# Patient Record
Sex: Male | Born: 1960 | Race: White | Hispanic: No | Marital: Married | Smoking: Former smoker
Health system: Southern US, Community
[De-identification: ages and names within clinical notes are randomized; demographics above are authoritative.]

## PROBLEM LIST (undated history)

## (undated) DIAGNOSIS — Z0000 Encounter for general adult medical examination without abnormal findings: Principal | ICD-10-CM

## (undated) DIAGNOSIS — Z1211 Encounter for screening for malignant neoplasm of colon: Secondary | ICD-10-CM

## (undated) DIAGNOSIS — L299 Pruritus, unspecified: Principal | ICD-10-CM

## (undated) DIAGNOSIS — H9190 Unspecified hearing loss, unspecified ear: Secondary | ICD-10-CM

## (undated) DIAGNOSIS — I469 Cardiac arrest, cause unspecified: Secondary | ICD-10-CM

## (undated) DIAGNOSIS — N400 Enlarged prostate without lower urinary tract symptoms: Secondary | ICD-10-CM

## (undated) DIAGNOSIS — E785 Hyperlipidemia, unspecified: Secondary | ICD-10-CM

## (undated) DIAGNOSIS — Z8249 Family history of ischemic heart disease and other diseases of the circulatory system: Secondary | ICD-10-CM

## (undated) DIAGNOSIS — R7301 Impaired fasting glucose: Secondary | ICD-10-CM

## (undated) DIAGNOSIS — Z87891 Personal history of nicotine dependence: Secondary | ICD-10-CM

## (undated) HISTORY — DX: Personal history of nicotine dependence: Z87.891

## (undated) HISTORY — DX: Benign prostatic hyperplasia without lower urinary tract symptoms: N40.0

## (undated) HISTORY — DX: Impaired fasting glucose: R73.01

## (undated) HISTORY — DX: Family history of ischemic heart disease and other diseases of the circulatory system: Z82.49

## (undated) HISTORY — PX: WISDOM TOOTH EXTRACTION: SHX21

## (undated) HISTORY — DX: Hyperlipidemia, unspecified: E78.5

## (undated) HISTORY — DX: Unspecified hearing loss, unspecified ear: H91.90

---

## 2017-06-08 ENCOUNTER — Other Ambulatory Visit: Payer: Self-pay | Admitting: Internal Medicine

## 2017-06-08 ENCOUNTER — Other Ambulatory Visit (INDEPENDENT_AMBULATORY_CARE_PROVIDER_SITE_OTHER): Payer: Self-pay | Admitting: Otolaryngology

## 2017-06-08 DIAGNOSIS — Z8249 Family history of ischemic heart disease and other diseases of the circulatory system: Secondary | ICD-10-CM

## 2017-06-08 DIAGNOSIS — E785 Hyperlipidemia, unspecified: Secondary | ICD-10-CM

## 2017-06-08 DIAGNOSIS — H9012 Conductive hearing loss, unilateral, left ear, with unrestricted hearing on the contralateral side: Secondary | ICD-10-CM

## 2017-06-23 ENCOUNTER — Ambulatory Visit
Admission: RE | Admit: 2017-06-23 | Discharge: 2017-06-23 | Disposition: A | Discharge status: Home / Self Care | Payer: Commercial Managed Care - PPO | Source: Ambulatory Visit | Attending: Internal Medicine | Admitting: Internal Medicine

## 2017-06-23 ENCOUNTER — Ambulatory Visit
Admission: RE | Admit: 2017-06-23 | Discharge: 2017-06-23 | Disposition: A | Discharge status: Home / Self Care | Payer: Commercial Managed Care - PPO | Source: Ambulatory Visit | Attending: Otolaryngology | Admitting: Otolaryngology

## 2017-06-23 DIAGNOSIS — E785 Hyperlipidemia, unspecified: Secondary | ICD-10-CM

## 2017-06-23 DIAGNOSIS — Z8249 Family history of ischemic heart disease and other diseases of the circulatory system: Secondary | ICD-10-CM

## 2017-06-23 DIAGNOSIS — H9012 Conductive hearing loss, unilateral, left ear, with unrestricted hearing on the contralateral side: Secondary | ICD-10-CM

## 2017-07-12 ENCOUNTER — Other Ambulatory Visit: Payer: Self-pay

## 2017-07-12 ENCOUNTER — Encounter (HOSPITAL_BASED_OUTPATIENT_CLINIC_OR_DEPARTMENT_OTHER): Payer: Self-pay | Admitting: *Deleted

## 2017-07-14 ENCOUNTER — Other Ambulatory Visit: Payer: Self-pay | Admitting: Otolaryngology

## 2017-07-19 ENCOUNTER — Encounter (HOSPITAL_BASED_OUTPATIENT_CLINIC_OR_DEPARTMENT_OTHER)
Admission: RE | Disposition: A | Discharge status: Home / Self Care | Payer: Self-pay | Source: Ambulatory Visit | Attending: Otolaryngology

## 2017-07-19 ENCOUNTER — Ambulatory Visit (HOSPITAL_BASED_OUTPATIENT_CLINIC_OR_DEPARTMENT_OTHER)
Admission: RE | Admit: 2017-07-19 | Discharge: 2017-07-19 | Disposition: A | Discharge status: Home / Self Care | Payer: Commercial Managed Care - PPO | Source: Ambulatory Visit | Attending: Otolaryngology | Admitting: Otolaryngology

## 2017-07-19 ENCOUNTER — Other Ambulatory Visit: Payer: Self-pay

## 2017-07-19 ENCOUNTER — Ambulatory Visit (HOSPITAL_BASED_OUTPATIENT_CLINIC_OR_DEPARTMENT_OTHER): Payer: Commercial Managed Care - PPO | Admitting: Anesthesiology

## 2017-07-19 ENCOUNTER — Encounter (HOSPITAL_BASED_OUTPATIENT_CLINIC_OR_DEPARTMENT_OTHER): Payer: Self-pay | Admitting: Anesthesiology

## 2017-07-19 DIAGNOSIS — H7122 Cholesteatoma of mastoid, left ear: Secondary | ICD-10-CM | POA: Insufficient documentation

## 2017-07-19 DIAGNOSIS — Z87891 Personal history of nicotine dependence: Secondary | ICD-10-CM | POA: Diagnosis not present

## 2017-07-19 DIAGNOSIS — H7012 Chronic mastoiditis, left ear: Secondary | ICD-10-CM | POA: Diagnosis not present

## 2017-07-19 DIAGNOSIS — H9012 Conductive hearing loss, unilateral, left ear, with unrestricted hearing on the contralateral side: Secondary | ICD-10-CM | POA: Insufficient documentation

## 2017-07-19 HISTORY — PX: TYMPANOMASTOIDECTOMY: SHX34

## 2017-07-19 SURGERY — TYMPANOPLASTY, WITH MASTOIDECTOMY
Anesthesia: General | Site: Ear | Laterality: Left

## 2017-07-19 MED ORDER — MIDAZOLAM HCL 2 MG/2ML IJ SOLN
INTRAMUSCULAR | Status: AC
  Filled 2017-07-19: qty 2

## 2017-07-19 MED ORDER — FENTANYL CITRATE (PF) 100 MCG/2ML IJ SOLN
INTRAMUSCULAR | Status: AC
  Filled 2017-07-19: qty 2

## 2017-07-19 MED ORDER — LIDOCAINE 2% (20 MG/ML) 5 ML SYRINGE
INTRAMUSCULAR | Status: DC | PRN
  Administered 2017-07-19: 80 mg via INTRAVENOUS

## 2017-07-19 MED ORDER — CIPROFLOXACIN-FLUOCINOLONE PF 0.3-0.025 % OT SOLN
OTIC | Status: DC | PRN
  Administered 2017-07-19: 1 mL via OTIC

## 2017-07-19 MED ORDER — ONDANSETRON HCL 4 MG/2ML IJ SOLN: 4.0000 mg | Freq: Once | INTRAMUSCULAR | Status: DC | PRN

## 2017-07-19 MED ORDER — EPINEPHRINE PF 1 MG/ML IJ SOLN
INTRAMUSCULAR | Status: DC | PRN
  Administered 2017-07-19: .5 mL

## 2017-07-19 MED ORDER — AMOXICILLIN 875 MG PO TABS: 875.0000 mg | ORAL_TABLET | Freq: Two times a day (BID) | ORAL | 0 refills | Status: DC

## 2017-07-19 MED ORDER — FENTANYL CITRATE (PF) 100 MCG/2ML IJ SOLN
50.0000 ug | INTRAMUSCULAR | Status: DC | PRN
  Administered 2017-07-19 (×2): 50 ug via INTRAVENOUS

## 2017-07-19 MED ORDER — LACTATED RINGERS IV SOLN
INTRAVENOUS | Status: DC
  Administered 2017-07-19 (×2): via INTRAVENOUS

## 2017-07-19 MED ORDER — SCOPOLAMINE 1 MG/3DAYS TD PT72: 1.0000 | MEDICATED_PATCH | Freq: Once | TRANSDERMAL | Status: DC | PRN

## 2017-07-19 MED ORDER — CIPROFLOXACIN-DEXAMETHASONE 0.3-0.1 % OT SUSP
OTIC | Status: DC | PRN
  Administered 2017-07-19: 4 [drp] via OTIC

## 2017-07-19 MED ORDER — DEXAMETHASONE SODIUM PHOSPHATE 4 MG/ML IJ SOLN
INTRAMUSCULAR | Status: DC | PRN
  Administered 2017-07-19: 10 mg via INTRAVENOUS

## 2017-07-19 MED ORDER — OXYCODONE-ACETAMINOPHEN 5-325 MG PO TABS: 1.0000 | ORAL_TABLET | ORAL | 0 refills | Status: DC | PRN

## 2017-07-19 MED ORDER — EPHEDRINE SULFATE 50 MG/ML IJ SOLN
INTRAMUSCULAR | Status: DC | PRN
  Administered 2017-07-19 (×2): 10 mg via INTRAVENOUS
  Administered 2017-07-19: 5 mg via INTRAVENOUS

## 2017-07-19 MED ORDER — LIDOCAINE-EPINEPHRINE 1 %-1:100000 IJ SOLN
INTRAMUSCULAR | Status: DC | PRN
  Administered 2017-07-19: 3.5 mL

## 2017-07-19 MED ORDER — CEFAZOLIN SODIUM 1 G IJ SOLR
INTRAMUSCULAR | Status: AC
  Filled 2017-07-19: qty 20

## 2017-07-19 MED ORDER — CEFAZOLIN SODIUM-DEXTROSE 2-3 GM-%(50ML) IV SOLR
INTRAVENOUS | Status: DC | PRN
  Administered 2017-07-19: 2 g via INTRAVENOUS

## 2017-07-19 MED ORDER — PROPOFOL 500 MG/50ML IV EMUL
INTRAVENOUS | Status: AC
  Filled 2017-07-19: qty 50

## 2017-07-19 MED ORDER — ROCURONIUM BROMIDE 100 MG/10ML IV SOLN
INTRAVENOUS | Status: DC | PRN
  Administered 2017-07-19: 20 mg via INTRAVENOUS
  Administered 2017-07-19: 10 mg via INTRAVENOUS
  Administered 2017-07-19: 40 mg via INTRAVENOUS

## 2017-07-19 MED ORDER — PROPOFOL 10 MG/ML IV BOLUS
INTRAVENOUS | Status: DC | PRN
  Administered 2017-07-19: 200 mg via INTRAVENOUS
  Administered 2017-07-19: 50 mg via INTRAVENOUS

## 2017-07-19 MED ORDER — FENTANYL CITRATE (PF) 100 MCG/2ML IJ SOLN
25.0000 ug | INTRAMUSCULAR | Status: DC | PRN
  Administered 2017-07-19 (×4): 25 ug via INTRAVENOUS

## 2017-07-19 MED ORDER — SUGAMMADEX SODIUM 200 MG/2ML IV SOLN
INTRAVENOUS | Status: AC
  Filled 2017-07-19: qty 2

## 2017-07-19 MED ORDER — OXYCODONE HCL 5 MG/5ML PO SOLN: 5.0000 mg | Freq: Once | ORAL | Status: DC | PRN

## 2017-07-19 MED ORDER — PROPOFOL 10 MG/ML IV BOLUS
INTRAVENOUS | Status: AC
  Filled 2017-07-19: qty 40

## 2017-07-19 MED ORDER — OXYCODONE HCL 5 MG PO TABS: 5.0000 mg | ORAL_TABLET | Freq: Once | ORAL | Status: DC | PRN

## 2017-07-19 MED ORDER — SUGAMMADEX SODIUM 200 MG/2ML IV SOLN
INTRAVENOUS | Status: DC | PRN
  Administered 2017-07-19: 200 mg via INTRAVENOUS

## 2017-07-19 MED ORDER — ATROPINE SULFATE 0.4 MG/ML IJ SOLN
INTRAMUSCULAR | Status: AC
  Filled 2017-07-19: qty 1

## 2017-07-19 MED ORDER — SUCCINYLCHOLINE CHLORIDE 20 MG/ML IJ SOLN
INTRAMUSCULAR | Status: DC | PRN
  Administered 2017-07-19: 100 mg via INTRAVENOUS

## 2017-07-19 MED ORDER — CIPROFLOXACIN-DEXAMETHASONE 0.3-0.1 % OT SUSP
OTIC | Status: AC
  Filled 2017-07-19: qty 7.5

## 2017-07-19 MED ORDER — MIDAZOLAM HCL 2 MG/2ML IJ SOLN
1.0000 mg | INTRAMUSCULAR | Status: DC | PRN
  Administered 2017-07-19: 2 mg via INTRAVENOUS

## 2017-07-19 SURGICAL SUPPLY — 60 items
BLADE CLIPPER SURG (BLADE) ×2 IMPLANT
BLADE NEEDLE 3 SS STRL (BLADE) IMPLANT
BUR DIAMOND COARSE 3.0 (BURR) ×2 IMPLANT
BUR RND OSTEON ELITE 6.0 (BURR) ×2 IMPLANT
CANISTER SUCT 1200ML W/VALVE (MISCELLANEOUS) ×2 IMPLANT
CORD BIPOLAR FORCEPS 12FT (ELECTRODE) IMPLANT
COTTONBALL LRG STERILE PKG (GAUZE/BANDAGES/DRESSINGS) ×2 IMPLANT
DECANTER SPIKE VIAL GLASS SM (MISCELLANEOUS) IMPLANT
DERMABOND ADVANCED (GAUZE/BANDAGES/DRESSINGS) ×1
DERMABOND ADVANCED .7 DNX12 (GAUZE/BANDAGES/DRESSINGS) ×1 IMPLANT
DRAPE MICROSCOPE WILD 40.5X102 (DRAPES) ×2 IMPLANT
DRAPE SURG 17X23 STRL (DRAPES) ×2 IMPLANT
DRAPE SURG IRRIG POUCH 19X23 (DRAPES) ×2 IMPLANT
DRSG GLASSCOCK MASTOID ADT (GAUZE/BANDAGES/DRESSINGS) ×2 IMPLANT
DRSG GLASSCOCK MASTOID PED (GAUZE/BANDAGES/DRESSINGS) IMPLANT
ELECT COATED BLADE 2.86 ST (ELECTRODE) ×2 IMPLANT
ELECT PAIRED SUBDERMAL (MISCELLANEOUS) ×2
ELECT REM PT RETURN 9FT ADLT (ELECTROSURGICAL) ×2
ELECTRODE PAIRED SUBDERMAL (MISCELLANEOUS) ×1 IMPLANT
ELECTRODE REM PT RTRN 9FT ADLT (ELECTROSURGICAL) ×1 IMPLANT
GAUZE SPONGE 4X4 12PLY STRL (GAUZE/BANDAGES/DRESSINGS) IMPLANT
GAUZE SPONGE 4X4 12PLY STRL LF (GAUZE/BANDAGES/DRESSINGS) IMPLANT
GAUZE SPONGE 4X4 16PLY XRAY LF (GAUZE/BANDAGES/DRESSINGS) ×2 IMPLANT
GLOVE BIO SURGEON STRL SZ 6.5 (GLOVE) ×2 IMPLANT
GLOVE BIO SURGEON STRL SZ7.5 (GLOVE) ×2 IMPLANT
GLOVE BIOGEL PI IND STRL 7.0 (GLOVE) ×1 IMPLANT
GLOVE BIOGEL PI INDICATOR 7.0 (GLOVE) ×1
GOWN STRL REUS W/ TWL LRG LVL3 (GOWN DISPOSABLE) ×2 IMPLANT
GOWN STRL REUS W/TWL LRG LVL3 (GOWN DISPOSABLE) ×2
HEMOSTAT SURGICEL .5X2 ABSORB (HEMOSTASIS) IMPLANT
HEMOSTAT SURGICEL 2X14 (HEMOSTASIS) IMPLANT
IV CATH AUTO 14GX1.75 SAFE ORG (IV SOLUTION) ×2 IMPLANT
IV NS 500ML (IV SOLUTION) ×1
IV NS 500ML BAXH (IV SOLUTION) ×1 IMPLANT
IV SET EXT 30 76VOL 4 MALE LL (IV SETS) ×2 IMPLANT
NDL SAFETY ECLIPSE 18X1.5 (NEEDLE) ×1 IMPLANT
NEEDLE FILTER BLUNT 18X 1/2SAF (NEEDLE)
NEEDLE FILTER BLUNT 18X1 1/2 (NEEDLE) IMPLANT
NEEDLE HYPO 18GX1.5 SHARP (NEEDLE) ×1
NEEDLE HYPO 25X1 1.5 SAFETY (NEEDLE) ×2 IMPLANT
NS IRRIG 1000ML POUR BTL (IV SOLUTION) ×2 IMPLANT
PACK AMBRUS EAR (MISCELLANEOUS) ×2 IMPLANT
PACK BASIN DAY SURGERY FS (CUSTOM PROCEDURE TRAY) ×2 IMPLANT
PACK ENT DAY SURGERY (CUSTOM PROCEDURE TRAY) ×2 IMPLANT
PENCIL BUTTON HOLSTER BLD 10FT (ELECTRODE) ×2 IMPLANT
PROBE NERVBE PRASS .33 (MISCELLANEOUS) IMPLANT
SLEEVE SCD COMPRESS KNEE MED (MISCELLANEOUS) ×2 IMPLANT
SPONGE SURGIFOAM ABS GEL 12-7 (HEMOSTASIS) ×2 IMPLANT
SUT BONE WAX W31G (SUTURE) IMPLANT
SUT VIC AB 3-0 SH 27 (SUTURE)
SUT VIC AB 3-0 SH 27X BRD (SUTURE) IMPLANT
SUT VIC AB 4-0 P-3 18XBRD (SUTURE) IMPLANT
SUT VIC AB 4-0 P3 18 (SUTURE)
SUT VICRYL 4-0 PS2 18IN ABS (SUTURE) ×2 IMPLANT
SYR 5ML LL (SYRINGE) ×2 IMPLANT
SYR BULB 3OZ (MISCELLANEOUS) ×2 IMPLANT
TOWEL OR 17X24 6PK STRL BLUE (TOWEL DISPOSABLE) ×2 IMPLANT
TOWEL OR NON WOVEN STRL DISP B (DISPOSABLE) IMPLANT
TRAY DSU PREP LF (CUSTOM PROCEDURE TRAY) ×2 IMPLANT
TUBING IRRIGATION (MISCELLANEOUS) ×2 IMPLANT

## 2017-07-19 NOTE — Anesthesia Procedure Notes (Signed)
Procedure Name: Intubation Date/Time: 07/19/2017 8:28 AM Performed by: Caren Macadamarter, Perri Lamagna W, CRNA Pre-anesthesia Checklist: Patient identified, Emergency Drugs available, Suction available and Patient being monitored Patient Re-evaluated:Patient Re-evaluated prior to induction Oxygen Delivery Method: Circle system utilized Preoxygenation: Pre-oxygenation with 100% oxygen Induction Type: IV induction Ventilation: Mask ventilation without difficulty Laryngoscope Size: Miller and 2 Grade View: Grade I Tube type: Oral Tube size: 7.0 mm Number of attempts: 1 Airway Equipment and Method: Stylet and Oral airway Placement Confirmation: ETT inserted through vocal cords under direct vision,  positive ETCO2 and breath sounds checked- equal and bilateral Secured at: 22 cm Tube secured with: Tape Dental Injury: Teeth and Oropharynx as per pre-operative assessment

## 2017-07-19 NOTE — Discharge Instructions (Addendum)
POSTOPERATIVE INSTRUCTIONS FOR PATIENTS HAVING MASTOIDECTOMY SURGERY  1. You may have nausea, vomiting, or a low grade fever for a few days after surgery. This is not unusual.  However, if the nausea and vomiting become severe or last more than one day, please call our office. Medication for nausea may be prescribed. You may take Tylenol every four hours for fever. If your fever should rise above 101 F, please contact our office. 2. Limit your activities for one week. This includes avoiding heavy lifting (over 20lbs), vigorous exercise, and contact sports. 3. Do not blow your nose for approximately one week.  Any accumulation in the nose should be drawn back into the throat and expectorated through the mouth to avoid infecting the ear. If it is necessary to sneeze, do so with your mouth open to decrease pressure to your ears. Do not hold your nose to avoid sneezing.  4. Some dull postoperative ear pain is expected. Your physician may prescribe pain medicine to help relieve your discomfort. If your postoperative pain increases and your medication is not helping, please call the office before taking any other medication that we have not prescribed or recommended. 5. If any of the following should occur, contact Dr.Teoh:   (Office: [phone])) a. Persistent bleeding                                                             b. Persistent fever c. Purulent drainage (pus) from the ear or incision d. Increasing redness around the suture line e. Persistent pain or dizziness f. Facial weakness 6. Sometimes, with a larger incision behind the ear, the incision may open and drain. If it occurs, please contact our office. 7. If your physician prescribes an antibiotic, fill the prescription promptly and take all of the medicine as directed until the entire supply is gone. 8.  You may experience some popping and cracking sounds in the ear for up to several weeks. It may sound like you are talking in a  barrel or a tunnel. This is normal and should not cause concern. 9. Because a nerve for taste passes through your ear, it is not unusual for your taste sensation to be altered for several weeks or months. 10. You may experience some numbness in your outer ear, earlobe, and the incision area. This is normal, and most of the numbness will be expected to fade over a period of time.                                                               11. Your eardrum may look pink or red for up to a month postoperatively. The red coloration is due to fluid in the middle ear. The change in color should not be confused with infection.  12. It is important to return to our office for your postoperative appointment as scheduled. If for some reason you were not given a postoperative appointment, please call our office at [phone].   Post Anesthesia Home Care Instructions  Activity: Get plenty of rest for the remainder of the day.  A responsible individual must stay with you for 24 hours following the procedure.  For the next 24 hours, DO NOT: -Drive a car -Advertising copywriterperate machinery -Drink alcoholic beverages -Take any medication unless instructed by your physician -Make any legal decisions or sign important papers.  Meals: Start with liquid foods such as gelatin or soup. Progress to regular foods as tolerated. Avoid greasy, spicy, heavy foods. If nausea and/or vomiting occur, drink only clear liquids until the nausea and/or vomiting subsides. Call your physician if vomiting continues.  Special Instructions/Symptoms: Your throat may feel dry or sore from the anesthesia or the breathing tube placed in your throat during surgery. If this causes discomfort, gargle with warm salt water. The discomfort should disappear within 24 hours.  If you had a scopolamine patch placed behind your ear for the management of post- operative nausea and/or vomiting:  1. The medication in the patch is effective for 72 hours,  after which it should be removed.  Wrap patch in a tissue and discard in the trash. Wash hands thoroughly with soap and water. 2. You may remove the patch earlier than 72 hours if you experience unpleasant side effects which may include dry mouth, dizziness or visual disturbances. 3. Avoid touching the patch. Wash your hands with soap and water after contact with the patch.

## 2017-07-19 NOTE — Transfer of Care (Signed)
Immediate Anesthesia Transfer of Care Note  Patient: Jake KicksDavid N Musolino  Procedure(s) Performed: LEFT CANAL WALL TYMPANOMASTOIDECTOMY (Left Ear)  Patient Location: PACU  Anesthesia Type:General  Level of Consciousness: awake and alert   Airway & Oxygen Therapy: Patient Spontanous Breathing and Patient connected to face mask oxygen  Post-op Assessment: Report given to RN and Post -op Vital signs reviewed and stable  Post vital signs: Reviewed and stable  Last Vitals:  Vitals Value Taken Time  BP    Temp    Pulse    Resp    SpO2      Last Pain:  Vitals:   07/19/17 0733  TempSrc: Oral         Complications: No apparent anesthesia complications

## 2017-07-19 NOTE — H&P (Signed)
Cc: Left ear hearing loss, cholesteatoma  HPI: The patient is a 57 year old male who returns today for his follow-up evaluation.  The patient has a history of left middle ear effusion and recurrent left ear drainage.  He previously underwent left myringotomy and tube placement.  Despite tube placement, he continues to have significant left ear conductive hearing loss.  He underwent a temporal bone CT scan this month.  The CT showed opacification of his left middle ear space and the left mastoid air cells.  The findings were concerning for cholesteatoma.  The patient returns today complaining of persistent left ear hearing difficulty.  He has noted intermittent drainage from the left ear.  He currently denies any vertigo or headache. No other ENT, GI, or respiratory issue noted since the last visit.   Exam: General: Communicates without difficulty, well nourished, no acute distress. Head: Normocephalic, no evidence injury, no tenderness, facial buttresses intact without stepoff. Eyes: PERRL, EOMI. No scleral icterus, conjunctivae clear. Neuro: CN II exam reveals vision grossly intact. No nystagmus at any point of gaze. Ears: Auricles well formed without lesions. Ear canals are intact without mass or lesion. No erythema or edema is appreciated. The left ventilating tube is noted to be in place and patent. No drainage is noted. The left TM is retracted. Nose: External evaluation reveals normal support and skin without lesions. Dorsum is intact. Anterior rhinoscopy reveals mildly congested mucosa over anterior aspect of inferior turbinates and intact septum. No purulence noted. Oral:  Oral cavity and oropharynx are intact, symmetric, without erythema or edema. Mucosa is moist without lesions. Neck: Full range of motion without pain. There is no significant lymphadenopathy. No masses palpable. Thyroid bed within normal limits to palpation. Parotid glands and submandibular glands equal bilaterally without mass.  Trachea is midline. Neuro:  CN 2-12 grossly intact. Gait normal. Vestibular: No nystagmus at any point of gaze. The cerebellar examination is unremarkable.   Assessment 1.  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   2.  The left ventilating tube is in place and patent.   Plan  1.  The physical exam findings and the CT images are extensively reviewed with the patient.  2.  The pathophysiology of cholesteatoma is also reviewed with the patient and his wife.  3.  Based on the above findings, the patient will need to undergo tympanomastoidectomy surgery to remove the cholesteatoma. The options of canal wall-up versus canal wall-down approaches are discussed.  4.  The patient is interested in proceeding with canal wall-down tympanomastoidectomy surgery.  We will schedule the procedure in accordance with the patient's schedule.

## 2017-07-19 NOTE — Anesthesia Postprocedure Evaluation (Signed)
Anesthesia Post Note  Patient: Jake Patterson  Procedure(s) Performed: LEFT CANAL WALL TYMPANOMASTOIDECTOMY (Left Ear)     Patient location during evaluation: PACU Anesthesia Type: General Level of consciousness: awake and alert Pain management: pain level controlled Vital Signs Assessment: post-procedure vital signs reviewed and stable Respiratory status: spontaneous breathing, nonlabored ventilation, respiratory function stable and patient connected to nasal cannula oxygen Cardiovascular status: blood pressure returned to baseline and stable Postop Assessment: no apparent nausea or vomiting Anesthetic complications: no    Last Vitals:  Vitals:   07/19/17 1200 07/19/17 1215  BP: 135/79 122/82  Pulse: 87 79  Resp: 10 12  Temp:    SpO2: 94% 96%    Last Pain:  Vitals:   07/19/17 1215  TempSrc:   PainSc: 2                  Doroteo Nickolson,Myreon COKER

## 2017-07-19 NOTE — Anesthesia Preprocedure Evaluation (Addendum)
Anesthesia Evaluation  Patient identified by MRN, date of birth, ID band Patient awake    Reviewed: Allergy & Precautions, NPO status , Patient's Chart, lab work & pertinent test results  Airway Mallampati: II  TM Distance: >3 FB Neck ROM: Full    Dental  (+) Teeth Intact, Dental Advisory Given   Pulmonary former smoker,    breath sounds clear to auscultation       Cardiovascular  Rhythm:Regular Rate:Normal     Neuro/Psych    GI/Hepatic   Endo/Other    Renal/GU      Musculoskeletal   Abdominal   Peds  Hematology   Anesthesia Other Findings   Reproductive/Obstetrics                            Anesthesia Physical Anesthesia Plan  ASA: II  Anesthesia Plan: General   Post-op Pain Management:    Induction: Intravenous  PONV Risk Score and Plan: Ondansetron and Treatment may vary due to age or medical condition  Airway Management Planned: Oral ETT  Additional Equipment:   Intra-op Plan:   Post-operative Plan: Extubation in OR  Informed Consent: I have reviewed the patients History and Physical, chart, labs and discussed the procedure including the risks, benefits and alternatives for the proposed anesthesia with the patient or authorized representative who has indicated his/her understanding and acceptance.   Dental advisory given  Plan Discussed with: CRNA and Anesthesiologist  Anesthesia Plan Comments:         Anesthesia Quick Evaluation

## 2017-07-19 NOTE — Op Note (Signed)
DATE OF PROCEDURE: 07/19/2017  SURGEON: Newman Pies, MD  OPERATIVE REPORT   PREOPERATIVE DIAGNOSIS:  1. Left ear cholesteatoma 2. Left chronic otomastoiditis 3. Left ear conductive hearing loss  POSTOPERATIVE DIAGNOSIS:  1. Left ear cholesteatoma 2. Left chronic otomastoiditis 3. Left ear conductive hearing loss  PROCEDURES PERFORMED: 1. Left modified radical canal-wall-down tympanomastoidectomy  ANESTHESIA: General endotracheal tube anesthesia.  COMPLICATIONS: None.  ESTIMATED BLOOD LOSS:  INDICATION FOR PROCEDURE:   Jake Patterson is a 57 y.o. male with a history of left ear chronic otomastoiditis and left ear conductive hearing loss. He was previously treated with multiple courses of topical antibiotics. However, he continues to have persistent left ear drainage.  On his CT scan, he was noted to have extensive soft tissue within the left middle ear cavity, extending into the epitympanum and filling Prussak's space, consistent with acquired cholesteatoma. There was also diffuse soft tissue or fluid throughout the mastoid air cells, representing extension of cholesteatoma. Based on the above findings, the decision was made for patient to undergo the above-stated procedure. The risks, benefits, alternatives, and details of the procedures were discussed with the patient. Questions were invited and answered. Informed consent was obtained.  DESCRIPTION OF PROCEDURE: The patient was taken to the operating room and placed supine on the operating table. General endotracheal tube anesthesia was induced by the anesthesiologist.The patient was positioned and prepped and draped in a standard fashion for left ear surgery. Facial nerve monitoring electrodes were placed. The facial nerve monitoring system was functional throughout the case.  1% lidocaine with 1-100,000 epinephrine was infiltrated into the left postauricular crease and into all 4 quadrants of the ear canal. Under  the operating microscope, the left ear canal was cleaned of all cerumen. The pars flaccida portion of the tympanic membrane was noted to be retracted. A standard tympanomeatal flap was elevated in a standard fashion. The malleus and incus were partially eroded.  A standard postauricular incision was made. The soft tissue covering the mastoid cortex was carefully elevated. A 2 x 2 centimeters temporalis fascia graft was harvested in the standard fashion. Using a #7 cutting bur, a standard cortical mastoidectomy procedure was performed. The mastoid was significantly sclerotic. The tegmen and sigmoid sinus were identified and preserved. The posterior canal wall was taken down to the level of the facial nerve. The antrum was entered. The partially eroded malleus and incus were removed. The stapes was noted to be intact and mobile. All visible granulation tissue and cholesteatoma were removed. The mastoid and middle ear space were copiously irrigated. The previously harvested temporalis fascia graft was used to reconstruct a new tympanum. Gelfoam soaked with otovel solution was used to pack the middle ear space and the mastoid cavity.  Attention was then focused on the meatoplasty portion of the case. A 6:00 and 12:00 incision was made. The posterior concha cartilage was removed. The posterior soft tissue flap was then sutured to the posterior aspect of the mastoid cavity. A large meatal opening was achieved.  The postauricular incision was then closed in layers with 4-0 Vicryl and Dermabond. A Glasscock dressing was applied. That concluded the procedure for the patient. The care of the patient was turned over to the anesthesiologist. The patient was awakened from anesthesia without difficulty. He was extubated and transferred to the recovery room in good condition.  OPERATIVE FINDINGS: Chronic otomastoiditis with a large amount of granulation tissue and cholesteatoma. The stapes was noted to be intact  and mobile.  SPECIMEN:  Right middle ear and mastoid contents  FOLLOWUP CARE: The patient will be discharged home once he is awake and alert. He will follow up in my office in 1 week.

## 2017-07-20 ENCOUNTER — Encounter (HOSPITAL_BASED_OUTPATIENT_CLINIC_OR_DEPARTMENT_OTHER): Payer: Self-pay | Admitting: Otolaryngology

## 2017-08-05 ENCOUNTER — Ambulatory Visit: Payer: Commercial Managed Care - PPO | Admitting: Cardiology

## 2017-08-11 ENCOUNTER — Encounter: Payer: Self-pay | Admitting: Cardiology

## 2017-08-11 ENCOUNTER — Ambulatory Visit (INDEPENDENT_AMBULATORY_CARE_PROVIDER_SITE_OTHER): Payer: Commercial Managed Care - PPO | Admitting: Cardiology

## 2017-08-11 VITALS — BP 120/72 | HR 82 | Ht 69.0 in | Wt 178.0 lb

## 2017-08-11 DIAGNOSIS — Z01818 Encounter for other preprocedural examination: Secondary | ICD-10-CM

## 2017-08-11 DIAGNOSIS — E785 Hyperlipidemia, unspecified: Secondary | ICD-10-CM | POA: Diagnosis not present

## 2017-08-11 DIAGNOSIS — I2510 Atherosclerotic heart disease of native coronary artery without angina pectoris: Secondary | ICD-10-CM

## 2017-08-11 DIAGNOSIS — Z8249 Family history of ischemic heart disease and other diseases of the circulatory system: Secondary | ICD-10-CM | POA: Insufficient documentation

## 2017-08-11 MED ORDER — METOPROLOL TARTRATE 50 MG PO TABS: 50.0000 mg | ORAL_TABLET | Freq: Once | ORAL | 0 refills | Status: DC

## 2017-08-11 NOTE — Patient Instructions (Signed)
MEDICATION INSTRUCTIONS  NO CHANGES   EXCEPT ON THE DAY OF CORONARY CTA     WILL SCHEDULE AT [facility] Your physician has requested that you have cardiac CT. Cardiac computed tomography (CT) is a painless test that uses an x-ray machine to take clear, detailed pictures of your heart. For further information please visit [email or web address] Please follow instruction sheet as given.    Your physician recommends that you schedule a follow-up appointment in 2 MONTHS WITH DR HARDING.      INSTRUCTIONS FOR  CORONARY CTA    Please arrive at the [facility] main entrance of [facility] at (30-45 minutes prior to test start time)  [facility] [address] [place], [place] [address] [phone]  Proceed to the [facility] Radiology Department (First Floor).  Please follow these instructions carefully (unless otherwise directed):  PLEASE HAVE LABS - BMP  AT LEAST ONE WEEK PRIOR TO TEST  On the Night Before the Test: . Drink plenty of water. . Do not consume any caffeinated/decaffeinated beverages or chocolate 12 hours prior to your test. . Do not take any antihistamines 12 hours prior to your test.    On the Day of the Test: . Drink plenty of water. Do not drink any water within one hour of the test. . Do not eat any food 4 hours prior to the test. . You may take your regular medications prior to the test. . Take 50 mg of lopressor (metoprolol) one hour before the test.   After the Test: . Drink plenty of water. . After receiving IV contrast, you may experience a mild flushed feeling. This is normal. . On occasion, you may experience a mild rash up to 24 hours after the test. This is not dangerous. If this occurs, you can take Benadryl 25 mg and increase your fluid intake. . If you experience trouble breathing, this can be serious. If it is severe call 911 IMMEDIATELY. If it is mild, please call our office.

## 2017-08-11 NOTE — Progress Notes (Signed)
PCP: Martha Clan, MD  Clinic Note:  Chief Complaint  Patient presents with  . New Patient (Initial Visit)    had CT of heart, PCP advised to come be seen, pt denies chest pains, can feel palpitations at times, denies SOB, and swelling in hands/feet    HPI: Jake Patterson is a 57 y.o. male who is being seen today for the evaluation of Abnormal Coronary Calcium Score (387) -coronary calcification within LAD and circumflex.  At the request of Martha Clan, MD.  Jake Patterson was seen on March 14 and then April 26 by Dr.Shaw.  In reviewing his past medical history and family history, he was noted that he has a pretty significant family history for CAD with his father having an MI dating back to age 57 with CABG at age 72.  Paternal grandfather also had early MIs.  -->  As part of the baseline evaluation he underwent coronary calcium score testing. He was also noted to have impaired fasting glucose levels, mild erectile dysfunction and hyperlipidemia as Cardiac Risk Factors.  Recent Hospitalizations: None  Studies Personally Reviewed - (if available, images/films reviewed: From Epic Chart or Care Everywhere)  Coronary Calcium Score June 23, 2016: Agatston score 385 (93rd percentile).  LAD and circumflex coronary calcification noted.   Interval History: Jake Patterson presents here today for cardiology evaluation really without any major complaints.  The biggest issue is had lately has been some hearing issues.  He denies any sensation whatsoever chest tightness or pressure with rest or exertion.  He does however note that he is not able to do exertion to the extent that he had been previously.  He also notes occasional "hiccups" or skipped heartbeats occur mostly at rest.  These are not significant and are relatively rare.  Cardiac review of symptoms: No chest pain or shortness of breath with rest or exertion. No PND, orthopnea or edema. No palpitations, lightheadedness, dizziness, weakness or  syncope/near syncope. No TIA/amaurosis fugax symptoms. No melena, hematochezia, hematuria, or epstaxis. No claudication.  ROS: A comprehensive was performed. Review of Systems  Constitutional: Negative for malaise/fatigue.  HENT: Positive for hearing loss (Has had some hearing issues with his left ear.  Improved). Negative for nosebleeds.   Respiratory: Negative for cough and shortness of breath.   Gastrointestinal: Negative for heartburn.  Musculoskeletal: Positive for joint pain (Mild arthritis pains).  Neurological: Negative for dizziness.  All other systems reviewed and are negative.   I have reviewed and (if needed) personally updated the patient's problem list, medications, allergies, past medical and surgical history, social and family history.   Past Medical History:  Diagnosis Date  . BPH (benign prostatic hyperplasia)   . Family history of premature CAD   . Former light tobacco smoker    Less than 1/4 pack/day x 20 years  . Hard of hearing    Left ear  . Hyperlipidemia with target LDL less than 100   . Impaired fasting glucose     Past Surgical History:  Procedure Laterality Date  . TYMPANOMASTOIDECTOMY Left 07/19/2017   Procedure: LEFT CANAL WALL TYMPANOMASTOIDECTOMY;  Surgeon: Newman Pies, MD;  Location: [facility];  Service: ENT;  Laterality: Left;  . WISDOM TOOTH EXTRACTION      Current Meds  Medication Sig  . Omega-3 Fatty Acids (FISH OIL PO) Take by mouth.  . Probiotic Product (PROBIOTIC DAILY PO) Take by mouth.  . rosuvastatin (CRESTOR) 20 MG tablet Take 20 mg by mouth daily.  Marland Kitchen  Saw Palmetto, Serenoa repens, (SAW PALMETTO PO) Take by mouth.  . sildenafil (REVATIO) 20 MG tablet Take 20 mg by mouth 3 (three) times daily.    No Known Allergies  Social History   Tobacco Use  . Smoking status: Former Smoker    Packs/day: 0.25    Years: 20.00    Pack years: 5.00    Types: Cigarettes    Start date: 08/13/1980    Last attempt to quit: 08/13/2000     Years since quitting: 17.0  . Smokeless tobacco: Never Used  . Tobacco comment: Quit smoking 2003  Substance Use Topics  . Alcohol use: Yes    Comment: Occass  . Drug use: Never   Social History   Social History Narrative   Is a married father 2 adult daughters   He moved to the [place] from [place] outside [place] in 1988.   He enjoys cycling, running and up until about a year ago was playing club soccer. -->  He currently exercises about 1/2- 1 1/2 hours every day doing cycling or running.   He works as an Art gallery manager for [employer]   He was a light smoker for about 20 years from 1982 to 2002.   Social alcohol.    family history includes Alzheimer's disease in his mother; Coronary artery disease (age of onset: 69) in his father; Coronary artery disease (age of onset: 89) in his paternal grandfather; Heart attack (age of onset: 69) in his father.  Wt Readings from Last 3 Encounters:  08/11/17 178 lb (80.7 kg)  07/19/17 186 lb 4 oz (84.5 kg)    PHYSICAL EXAM BP 120/72 (BP Location: Right Arm)   Pulse 82   Ht  (1.753 m)   Wt 178 lb (80.7 kg)   BMI 26.29 kg/m  Physical Exam  Constitutional: He appears well-developed and well-nourished. No distress.  Healthy-appearing.  Well-groomed.  HENT:  Head: Normocephalic and atraumatic.  Eyes: Pupils are equal, round, and reactive to light. Conjunctivae and EOM are normal.  Neck: Normal range of motion. Neck supple. No hepatojugular reflux and no JVD present. Carotid bruit is not present.  Cardiovascular: Normal rate, regular rhythm, normal heart sounds and intact distal pulses.  No extrasystoles are present. PMI is not displaced. Exam reveals no gallop and no friction rub.  No murmur heard. Pulmonary/Chest: Effort normal. No respiratory distress. He has no wheezes. He has no rales.  Abdominal: Soft. Bowel sounds are normal. He exhibits no distension. There is no tenderness. There is no rebound.  Musculoskeletal: Normal range of  motion. He exhibits no edema.  Neurological: He is alert. No cranial nerve deficit.  Skin: Skin is warm and dry.  Psychiatric: His behavior is normal. Judgment and thought content normal.  Nursing note and vitals reviewed.    Adult ECG Report  Rate: 82;  Rhythm: normal sinus rhythm and premature ventricular contractions (PVC) -interpolated.  Otherwise normal axis, intervals and durations.   Narrative Interpretation: Relatively normal EKG.  PVCs noted, but not symptomatic as they are interpolated.  Other studies Reviewed: Additional studies/ records that were reviewed today include:  Recent (pertinent) labs: February 2019  TC 212, TG 104, LDL 130, HDL 61.  A1c 5.4.  BUN/creatinine 19/1.2.  TSH 2.29.  ASSESSMENT / PLAN: Problem List Items Addressed This Visit    Hyperlipidemia with target LDL less than 100 (Chronic)    At present, until we know what is extent of coronary artery disease is, we will recommend LDL goal of  less than 100.  He has just been started on Crestor.      Relevant Medications   sildenafil (REVATIO) 20 MG tablet   Family history of premature CAD (Chronic)    Pretty significant family history of CAD.  That risk factor along with a more further evaluation of an abnormal coronary calcium score. Plan: Coronary CT angiogram. Statin, and aspirin.  Otherwise blood pressures well controlled so no beta-blocker is required. Continue to stay active and exercise --> if symptoms warrant, would then pursue more aggressive evaluation.       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  Since he is not actively having symptoms, I think  we will assess him with a Coronary CT Angiogram will allow [place] also check FFR if needed.   Baseline modification would include statin which we just started, and would recommend adding aspirin.      Relevant Medications   sildenafil (REVATIO) 20 MG tablet   Other Relevant Orders   Basic metabolic panel   CT CORONARY MORPH W/CTA COR W/SCORE W/CA W/CM &/OR WO/CM   CT CORONARY FRACTIONAL FLOW RESERVE DATA PREP   CT CORONARY FRACTIONAL FLOW RESERVE FLUID ANALYSIS    Other Visit Diagnoses    Pre-op testing           I spent a total of with the patient and chart review. >  50% of the time was spent in direct patient consultation.   Current medicines are reviewed at length with the patient today.  (+/- concerns) n/a The following changes have been made:  n/a  Patient Instructions  MEDICATION INSTRUCTIONS  NO CHANGES   EXCEPT ON THE DAY OF CORONARY CTA   WILL SCHEDULE AT [facility] Your physician has requested that you have cardiac CT. Cardiac computed tomography (CT) is a painless test that uses an x-ray machine to take clear, detailed pictures of your heart. For further information please visit [email or web address] Please follow instruction sheet as given.   Your physician recommends that you schedule a follow-up appointment in 2 MONTHS WITH DR HARDING.  Studies Ordered:   Orders Placed This Encounter  Procedures  . CT CORONARY MORPH W/CTA COR W/SCORE W/CA W/CM &/OR WO/CM  . CT CORONARY FRACTIONAL FLOW RESERVE DATA PREP  . CT CORONARY FRACTIONAL FLOW RESERVE FLUID ANALYSIS  . Basic metabolic panel      Bryan Lemma, M.D., M.S. Interventional Cardiologist   Pager # [phone] Phone # [phone] [address] [place], [place] [address]   Thank you for choosing Heart[facility] at [facility]!!

## 2017-08-13 ENCOUNTER — Encounter: Payer: Self-pay | Admitting: Cardiology

## 2017-08-13 NOTE — Assessment & Plan Note (Signed)
Relatively significant calcification noted on calcium score with an elevated score.  Since he is not actively having symptoms, I think we will assess him with a Coronary CT Angiogram will allow [place] also check FFR if needed.   Baseline modification would include statin which we just started, and would recommend adding aspirin.

## 2017-08-13 NOTE — Assessment & Plan Note (Signed)
Pretty significant family history of CAD.  That risk factor along with a more further evaluation of an abnormal coronary calcium score. Plan: Coronary CT angiogram. Statin, and aspirin.  Otherwise blood pressures well controlled so no beta-blocker is required. Continue to stay active and exercise --> if symptoms warrant, would then pursue more aggressive evaluation.

## 2017-08-13 NOTE — Assessment & Plan Note (Signed)
At present, until we know what is extent of coronary artery disease is, we will recommend LDL goal of less than 100.  He has just been started on Crestor.

## 2017-08-18 NOTE — Addendum Note (Signed)
Addended by: Barrie Dunker on: 08/18/2017 10:41 AM   Modules accepted: Orders

## 2017-09-15 LAB — BASIC METABOLIC PANEL
BUN / CREAT RATIO: 13 (ref 9–20)
BUN: 14 mg/dL (ref 6–24)
CO2: 26 mmol/L (ref 20–29)
CREATININE: 1.1 mg/dL (ref 0.76–1.27)
Calcium: 10.1 mg/dL (ref 8.7–10.2)
Chloride: 103 mmol/L (ref 96–106)
GFR calc Af Amer: 86 mL/min/{1.73_m2} (ref >59)
GFR, EST NON AFRICAN AMERICAN: 75 mL/min/{1.73_m2} (ref >59)
Glucose: 99 mg/dL (ref 65–99)
Potassium: 5.3 mmol/L — ABNORMAL HIGH (ref 3.5–5.2)
SODIUM: 142 mmol/L (ref 134–144)

## 2017-09-23 ENCOUNTER — Encounter (HOSPITAL_COMMUNITY): Payer: Self-pay

## 2017-09-23 ENCOUNTER — Other Ambulatory Visit: Payer: Self-pay

## 2017-09-23 ENCOUNTER — Observation Stay (HOSPITAL_COMMUNITY)
Admission: EM | Admit: 2017-09-23 | Discharge: 2017-09-25 | Disposition: A | Discharge status: Home / Self Care | Payer: Commercial Managed Care - PPO | Source: Other Acute Inpatient Hospital | Attending: Cardiology | Admitting: Cardiology

## 2017-09-23 ENCOUNTER — Encounter (HOSPITAL_COMMUNITY): Payer: Self-pay | Admitting: Emergency Medicine

## 2017-09-23 ENCOUNTER — Emergency Department (HOSPITAL_COMMUNITY): Payer: Commercial Managed Care - PPO

## 2017-09-23 ENCOUNTER — Ambulatory Visit (HOSPITAL_COMMUNITY): Payer: Commercial Managed Care - PPO

## 2017-09-23 ENCOUNTER — Ambulatory Visit (HOSPITAL_COMMUNITY)
Admission: RE | Admit: 2017-09-23 | Discharge: 2017-09-23 | Disposition: A | Discharge status: Home / Self Care | Payer: Commercial Managed Care - PPO | Source: Ambulatory Visit | Attending: Cardiology | Admitting: Cardiology

## 2017-09-23 DIAGNOSIS — I468 Cardiac arrest due to other underlying condition: Secondary | ICD-10-CM | POA: Diagnosis not present

## 2017-09-23 DIAGNOSIS — R001 Bradycardia, unspecified: Secondary | ICD-10-CM | POA: Diagnosis present

## 2017-09-23 DIAGNOSIS — Y92538 Other ambulatory health services establishments as the place of occurrence of the external cause: Secondary | ICD-10-CM | POA: Diagnosis not present

## 2017-09-23 DIAGNOSIS — N400 Enlarged prostate without lower urinary tract symptoms: Secondary | ICD-10-CM | POA: Insufficient documentation

## 2017-09-23 DIAGNOSIS — I469 Cardiac arrest, cause unspecified: Secondary | ICD-10-CM | POA: Diagnosis not present

## 2017-09-23 DIAGNOSIS — E785 Hyperlipidemia, unspecified: Secondary | ICD-10-CM | POA: Insufficient documentation

## 2017-09-23 DIAGNOSIS — I2510 Atherosclerotic heart disease of native coronary artery without angina pectoris: Secondary | ICD-10-CM | POA: Diagnosis not present

## 2017-09-23 DIAGNOSIS — T447X5A Adverse effect of beta-adrenoreceptor antagonists, initial encounter: Secondary | ICD-10-CM | POA: Diagnosis not present

## 2017-09-23 DIAGNOSIS — Z8249 Family history of ischemic heart disease and other diseases of the circulatory system: Secondary | ICD-10-CM | POA: Insufficient documentation

## 2017-09-23 DIAGNOSIS — Z87891 Personal history of nicotine dependence: Secondary | ICD-10-CM | POA: Insufficient documentation

## 2017-09-23 DIAGNOSIS — R112 Nausea with vomiting, unspecified: Secondary | ICD-10-CM | POA: Diagnosis not present

## 2017-09-23 HISTORY — DX: Cardiac arrest, cause unspecified: I46.9

## 2017-09-23 LAB — CBC WITH DIFFERENTIAL/PLATELET
Abs Immature Granulocytes: 0 10*3/uL (ref 0.0–0.1)
BASOS PCT: 0 %
Basophils Absolute: 0 10*3/uL (ref 0.0–0.1)
EOS ABS: 0.1 10*3/uL (ref 0.0–0.7)
Eosinophils Relative: 1 %
HCT: 38.7 % — ABNORMAL LOW (ref 39.0–52.0)
Hemoglobin: 13 g/dL (ref 13.0–17.0)
Immature Granulocytes: 0 %
Lymphocytes Relative: 24 %
Lymphs Abs: 1.9 10*3/uL (ref 0.7–4.0)
MCH: 31.1 pg (ref 26.0–34.0)
MCHC: 33.6 g/dL (ref 30.0–36.0)
MCV: 92.6 fL (ref 78.0–100.0)
MONO ABS: 0.6 10*3/uL (ref 0.1–1.0)
MONOS PCT: 7 %
Neutro Abs: 5.3 10*3/uL (ref 1.7–7.7)
Neutrophils Relative %: 68 %
PLATELETS: 260 10*3/uL (ref 150–400)
RBC: 4.18 MIL/uL — ABNORMAL LOW (ref 4.22–5.81)
RDW: 13.1 % (ref 11.5–15.5)
WBC: 7.9 10*3/uL (ref 4.0–10.5)

## 2017-09-23 LAB — BASIC METABOLIC PANEL
Anion gap: 9 (ref 5–15)
BUN: 12 mg/dL (ref 6–20)
CALCIUM: 9.3 mg/dL (ref 8.9–10.3)
CO2: 27 mmol/L (ref 22–32)
Chloride: 103 mmol/L (ref 101–111)
Creatinine, Ser: 1.03 mg/dL (ref 0.61–1.24)
GFR calc Af Amer: >60 mL/min (ref >60)
GLUCOSE: 96 mg/dL (ref 65–99)
Potassium: 4 mmol/L (ref 3.5–5.1)
Sodium: 139 mmol/L (ref 135–145)

## 2017-09-23 LAB — MAGNESIUM: Magnesium: 1.6 mg/dL — ABNORMAL LOW (ref 1.7–2.4)

## 2017-09-23 LAB — TROPONIN I: Troponin I: <0.03 ng/mL (ref <0.03)

## 2017-09-23 LAB — TSH: TSH: 0.815 u[IU]/mL (ref 0.350–4.500)

## 2017-09-23 LAB — PHOSPHORUS: Phosphorus: 2.1 mg/dL — ABNORMAL LOW (ref 2.5–4.6)

## 2017-09-23 MED ORDER — DOPAMINE-DEXTROSE 3.2-5 MG/ML-% IV SOLN
0.0000 ug/kg/min | INTRAVENOUS | Status: DC
  Administered 2017-09-23: 5 ug/kg/min via INTRAVENOUS
  Filled 2017-09-23: qty 250

## 2017-09-23 MED ORDER — ATROPINE SULFATE 1 MG/ML IJ SOLN
0.5000 mg | Freq: Once | INTRAMUSCULAR | Status: AC
  Administered 2017-09-23: 0.5 mg via INTRAVENOUS

## 2017-09-23 MED ORDER — PROCHLORPERAZINE EDISYLATE 10 MG/2ML IJ SOLN
10.0000 mg | INTRAMUSCULAR | Status: DC | PRN
  Administered 2017-09-23: 10 mg via INTRAVENOUS
  Filled 2017-09-23 (×2): qty 2

## 2017-09-23 MED ORDER — DEXTROSE 5 % IV SOLN
5.0000 mg | Freq: Once | INTRAVENOUS | Status: AC
  Administered 2017-09-23: 5 mg via INTRAVENOUS
  Filled 2017-09-23: qty 5

## 2017-09-23 MED ORDER — SODIUM CHLORIDE 0.9 % IV SOLN
INTRAVENOUS | Status: DC
  Administered 2017-09-23: 20 mL/h via INTRAVENOUS

## 2017-09-23 MED ORDER — ATROPINE SULFATE 1 MG/ML IJ SOLN
0.5000 mg | Freq: Once | INTRAMUSCULAR | Status: AC
  Administered 2017-09-23: 0.5 mg via INTRAVENOUS
  Filled 2017-09-23: qty 1

## 2017-09-23 MED ORDER — ONDANSETRON HCL 4 MG/2ML IJ SOLN
4.0000 mg | Freq: Once | INTRAMUSCULAR | Status: AC
  Administered 2017-09-23: 4 mg via INTRAVENOUS
  Filled 2017-09-23: qty 2

## 2017-09-23 MED ORDER — SODIUM CHLORIDE 0.9 % IV SOLN
1.0000 g | Freq: Once | INTRAVENOUS | Status: AC
  Administered 2017-09-23: 1 g via INTRAVENOUS
  Filled 2017-09-23: qty 10

## 2017-09-23 MED ORDER — GLUCAGON HCL RDNA (DIAGNOSTIC) 1 MG IJ SOLR
1.0000 mg/h | INTRAVENOUS | Status: DC
  Administered 2017-09-23: 1 mg/h via INTRAVENOUS
  Filled 2017-09-23 (×2): qty 5

## 2017-09-23 MED ORDER — ONDANSETRON HCL 4 MG/2ML IJ SOLN
4.0000 mg | Freq: Once | INTRAMUSCULAR | Status: AC
  Administered 2017-09-23: 4 mg via INTRAVENOUS

## 2017-09-23 NOTE — Progress Notes (Signed)
Patient arrived to radiology department as an outpatient for CT heart. Pt. Alert/oriented x4, no complaints of chest pain or discomfort on arrival. Patient in sinus rhythm with PACs, rate between 45-65. Pt states he took 50 mg of Lopressor 1 hour before exam as directed. Blood pressure 126/89. Around 1520, patient went asystole on monitor, no pulse palpated, chest compression started immediately. Patient received about 20-30 seconds of compression before ROSC achieved, no ACLS meds given. Patient awakened from event slightly confused but easily reoriented. He denies any chest pain. SBP 120s after event and HR in 40s with frequent PVCs & PACs. Dr. Delton SeeNelson called and informed of event & patient transferred to emergency room for further evaluation. Patient states he drove himself here to hospital and wife is currently in [place]. Information given to ED physician and ED nurse Selena BattenKim.

## 2017-09-23 NOTE — ED Triage Notes (Signed)
Pt brought to ED from IR where he was getting ready to have a CT Heart.  Pt was given Lopressor while waiting for CT.  RN's in IR report pt has a several second asytole on the monitor and compressions were started.  Pt regain pulses without any medications and regained consciousness.  On arrival to ED pt alert and oriented x's 4.  Color pale, skin warm and dry.  Pt denies any chest pain or other complaints at this time

## 2017-09-23 NOTE — ED Notes (Signed)
After obtaining blood for lab pt moaned then started to brady down into 30's then down to asystole.  This RN started cardiac compressions for approx 45 seconds to 1 min.  Then pt regained a heart rate and woke up.  Pt alert and oriented x's 4 on awaking.  Pt  continues to deny any chest pain or other complaints

## 2017-09-23 NOTE — Progress Notes (Signed)
Pt refusing ABG at this time. Pt states he's tired of having blood drawn, and has a bad reaction when we do. RT will continue to monitor.

## 2017-09-23 NOTE — H&P (Signed)
PULMONARY / CRITICAL CARE MEDICINE   Name: Jake Patterson MRN: [id number] DOB: 1960-11-19    ADMISSION DATE:  09/23/2017 CONSULTATION DATE:  09/23/2017  REFERRING MD:  Dr. Freida Busman EDP  CHIEF COMPLAINT:  Cardiac arrest.   HISTORY OF PRESENT ILLNESS:  57 year old male with PMH as below who presented to [facility] ED 6/13 following brief cardiac arrest. He was scheduled to have a cardiac CT due to significant family history. He took lopressor 50mg  1 hour prior to the procedure as instructed and developed progressive bradycardia into asystole (30 seconds at 15:20). Chest compressions were started and the patient had immediate return of circulation.  Upon arrival to the ED he was neuro-intact and denied chest pain. Shortly after having labs drawn he suffered an additional episode of asystole ( at 16:45) with ROSC after approximately one minute. He was started on glucagon infusion for recurrent bradycardia.   At time of my exam patient awake/alert, talkative, moving all extremities, answering questions. He denies CP, SOB, Abd pain. He reports N/V. HR 40 and SBP 100 on Glucagon infusion. He reports his baseline HR is 53.   PAST MEDICAL HISTORY :  He  has a past medical history of BPH (benign prostatic hyperplasia), Family history of premature CAD, Former light tobacco smoker, Hard of hearing, Hyperlipidemia with target LDL less than 100, and Impaired fasting glucose.  PAST SURGICAL HISTORY: He  has a past surgical history that includes Wisdom tooth extraction and tympanomastoidectomy (Left, 07/19/2017).  Allergies  Allergen Reactions  . Metoprolol Tartrate Other (See Comments)    Patient's heart "flatlined" and had to receive CPR    No current facility-administered medications on file prior to encounter.    Current Outpatient Medications on File Prior to Encounter  Medication Sig  . Multiple Vitamins-Minerals (CENTRUM SILVER PO) Take 1 tablet by mouth 3 (three) times a week.   . Omega-3 Fatty  Acids (FISH OIL PO) Take 2 capsules by mouth daily.   . Probiotic Product (PROBIOTIC DAILY PO) Take 1 capsule by mouth daily.   . rosuvastatin (CRESTOR) 10 MG tablet Take 10 mg by mouth every evening.   . Saw Palmetto, Serenoa repens, (SAW PALMETTO PO) Take 1 capsule by mouth daily.   . sildenafil (REVATIO) 20 MG tablet Take 20 mg by mouth daily as needed (for ED).   Marland Kitchen metoprolol tartrate (LOPRESSOR) 50 MG tablet Take 1 tablet (50 mg total) by mouth once for 1 dose. TAKE ONE HOUR PRIOR TO  SCHEDULE CARDAIC TEST (Patient not taking: Reported on 09/23/2017)  . oxyCODONE-acetaminophen (PERCOCET/ROXICET) 5-325 MG tablet Take 1-2 tablets by mouth every 4 (four) hours as needed for severe pain. (Patient not taking: Reported on 09/23/2017)   FAMILY HISTORY:  His indicated that his mother is deceased. He indicated that his father is deceased. He indicated that his paternal grandmother is deceased. He indicated that his paternal grandfather is deceased.  SOCIAL HISTORY: He  reports that he quit smoking about 17 years ago. His smoking use included cigarettes. He started smoking about 37 years ago. He has a 5.00 pack-year smoking history. He has never used smokeless tobacco. He reports that he drinks alcohol. He reports that he does not use drugs.  REVIEW OF SYSTEMS:   Review of Systems  Constitutional: Negative.   HENT: Negative.   Eyes: Negative.   Respiratory: Negative.   Cardiovascular: Negative.   Gastrointestinal: Positive for nausea and vomiting. Negative for abdominal pain.  Genitourinary: Negative.   Musculoskeletal: Negative.  Skin: Negative.   Neurological: Negative.   Endo/Heme/Allergies: Negative.   Psychiatric/Behavioral: Negative.    SUBJECTIVE:  Sitting up on ER stretcher, actively vomiting  VITAL SIGNS: BP 112/80   Pulse (!) 44   Temp 97.9 F (36.6 C) (Oral)   Resp 16   Ht 5\' 9"  (1.753 m)   Wt 79 kg (174 lb 4 oz)   SpO2 100%   BMI 25.73 kg/m   HEMODYNAMICS:  SBP 100  (not on vasopressors)  INTAKE / OUTPUT: No intake/output data recorded.  PHYSICAL EXAMINATION: General: WDWN Adult male in NAD Neuro: Awake/alert, PERRL, Moving all extremities, talkative, answering questions. HEENT: OP clear, MM moist  Cardiovascular: Sinus bradycardia @ 40; no m/r/g Lungs: CTA b/l Abdomen: Soft NTND Musculoskeletal: no LE edema Skin: no rashes   LABS:  BMET Recent Labs  Lab 09/23/17 1658  NA 139  K 4.0  CL 103  CO2 27  BUN 12  CREATININE 1.03  GLUCOSE 96   Electrolytes Recent Labs  Lab 09/23/17 1658  CALCIUM 9.3   CBC Recent Labs  Lab 09/23/17 1658  WBC 7.9  HGB 13.0  HCT 38.7*  PLT 260   Coag's No results for input(s): APTT, INR in the last 168 hours.  Sepsis Markers No results for input(s): LATICACIDVEN, PROCALCITON, O2SATVEN in the last 168 hours.  ABG No results for input(s): PHART, PCO2ART, PO2ART in the last 168 hours.  Liver Enzymes No results for input(s): AST, ALT, ALKPHOS, BILITOT, ALBUMIN in the last 168 hours.  Cardiac Enzymes Recent Labs  Lab 09/23/17 1658  TROPONINI <0.03   Glucose No results for input(s): GLUCAP in the last 168 hours.  Imaging Dg Chest Port 1 View  Result Date: 09/23/2017 CLINICAL DATA:  Shortness of breath. EXAM: PORTABLE CHEST 1 VIEW COMPARISON:  None. FINDINGS: 1608 hours. The lungs are clear without focal pneumonia, edema, pneumothorax or pleural effusion. Cardiopericardial silhouette is at upper limits of normal for size. The visualized bony structures of the thorax are intact. Telemetry leads overlie the chest. Defibrillator pads overlie the left hemithorax. IMPRESSION: No active disease. Electronically Signed   By: Kennith CenterEric  Mansell M.D.   On: 09/23/2017 16:16   CULTURES: None   ANTIBIOTICS: None   SIGNIFICANT EVENTS: 6/13 admit after cardiac arrest  LINES/TUBES: PIV's  DISCUSSION: 57 year old male with HLD and BPH, with strong family hx of CAD, [facility]  following brief asystole cardiac arrest precipitated by lopressor-induced bradycardia. Had 2nd brief asystole arrest in ER. Neuro intact but remains bradycardic.   Marland Kitchen. He was scheduled to have a cardiac CT due to significant family history. He took lopressor 50mg  1 hour prior to the procedure as instructed and developed progressive bradycardia into asystole (30 seconds at 15:20). Chest compressions were started and the patient had immediate return of circulation.  Upon arrival to the ED he was neuro-intact and denied chest pain. Shortly after having labs drawn he suffered an additional episode of asystole (1min at 16:45) with ROSC after approximately one minute. He was started on glucagon infusion for recurrent bradycardia.   ASSESSMENT / PLAN:  PULMONARY No active issues  CARDIOVASCULAR 1. Sinus bradycardia; Cardiac arrest  - developed sinus brady to 30 (baseline HR 53) after taking lopressor in preparation for a Cardiac CT. Then had asystole with 30sec CPR prior to achieving ROSC. In the ER he again had asystole when labs were bring drawn (vasovagaled?) with 1min CPR prior to ROSC.  - Neuro intact so  not cooling; Awake/alert, no respiratory distress. On RA.  - Remains bradycardic despite Atropine 0.5mg  IV x 1, Glucagon 5mg  IV x 1, and Glucagon infusion. Will start dopamine infusion - Order TTE - check TSH - EKG shows sinus brady at 40 with no ST changes; QTc 408. Troponin negative x 1; continue to trend.  - Consult Cardiology for admission and further workup of this persistent bradycardia.   RENAL No active issues   GASTROINTESTINAL 1. N/V: - likely due to the glucagon itself; since he is remaining bradycardic despite the glucagon, may consider stopping it in favor of dopamine instead. Defer to Cardiology - no improvement with zofran; will try compazine instead.   HEMATOLOGIC No active issues   INFECTIOUS No active issues   ENDOCRINE No active issues   NEUROLOGIC No active issues     FAMILY  - Updates: updated patient and his family member in ER exam room - Inter-disciplinary family meet or Palliative Care meeting due by:  09/29/17  60 minutes critical care time  Milana Obey, MD  Pulmonary and Critical Care Medicine [facility] Pager: [phone]  09/23/2017, 8:34 PM

## 2017-09-23 NOTE — ED Provider Notes (Signed)
[facility] EMERGENCY DEPARTMENT Provider Note   CSN: [id number] Arrival date & time: 09/23/17  1539     History   Chief Complaint Chief Complaint  Patient presents with  . Bradycardia    HPI Jake Patterson is a 57 y.o. male.  57 year old male presents with bradycardia and asystole after taking Lopressor approximate 1 hour prior.  Patient was scheduled to have a coronary CT due to his family history of coronary artery disease as well as hypercholesterolemia.  States he became dizzy and lightheaded and on the monitor of the staff and IR noted that patient's heart rate went down into the 30s and he became asystolic.  Patient did have chest compressions performed and had immediate return of heart rate.  He was combative initially but now is back to baseline.  States that his baseline heart rate is in the low 50s.  States he is very active.  Denies any recent chest pain or anginal symptoms.  Feels back to his baseline at this time.     Past Medical History:  Diagnosis Date  . BPH (benign prostatic hyperplasia)   . Family history of premature CAD   . Former light tobacco smoker    Less than 1/4 pack/day x 20 years  . Hard of hearing    Left ear  . Hyperlipidemia with target LDL less than 100   . Impaired fasting glucose     Patient Active Problem List   Diagnosis Date Noted  . 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 Family history of premature CAD 08/11/2017  . Hyperlipidemia with target LDL less than 100 08/11/2017    Past Surgical History:  Procedure Laterality Date  . TYMPANOMASTOIDECTOMY Left 07/19/2017   Procedure: LEFT CANAL WALL TYMPANOMASTOIDECTOMY;  Surgeon: Newman Pies, MD;  Location: [facility];  Service: ENT;  Laterality: Left;  . WISDOM TOOTH EXTRACTION          Home Medications    Prior to Admission medications   Medication Sig Start Date End Date Taking? Authorizing  Provider  metoprolol tartrate (LOPRESSOR) 50 MG tablet Take 1 tablet (50 mg total) by mouth once for 1 dose. TAKE ONE HOUR PRIOR TO  SCHEDULE CARDAIC TEST 08/11/17 08/11/17  Marykay Lex, MD  Multiple Vitamins-Minerals (CENTRUM SILVER PO) Take 1 tablet by mouth daily.    [provider]  Omega-3 Fatty Acids (FISH OIL PO) Take by mouth.    [provider]  oxyCODONE-acetaminophen (PERCOCET/ROXICET) 5-325 MG tablet Take 1-2 tablets by mouth every 4 (four) hours as needed for severe pain. Patient not taking: Reported on 08/11/2017 07/19/17   Newman Pies, MD  Probiotic Product (PROBIOTIC DAILY PO) Take by mouth.    [provider]  rosuvastatin (CRESTOR) 20 MG tablet Take 20 mg by mouth daily.    [provider]  Saw Palmetto, Serenoa repens, (SAW PALMETTO PO) Take by mouth.    [provider]  sildenafil (REVATIO) 20 MG tablet Take 20 mg by mouth 3 (three) times daily.    [provider]    Family History Family History  Problem Relation Age of Onset  . Alzheimer's disease Mother        Diet 18  . Coronary artery disease Father 73  . Heart attack Father 79       CABG at age 49  . Coronary artery disease Paternal Grandfather 55    Social History Social History  Tobacco Use  . Smoking status: Former Smoker    Packs/day: 0.25    Years: 20.00    Pack years: 5.00    Types: Cigarettes    Start date: 08/13/1980    Last attempt to quit: 08/13/2000    Years since quitting: 17.1  . Smokeless tobacco: Never Used  . Tobacco comment: Quit smoking 2003  Substance Use Topics  . Alcohol use: Yes    Comment: Occass  . Drug use: Never     Allergies   Patient has no known allergies.   Review of Systems Review of Systems  All other systems reviewed and are negative.    Physical Exam Updated Vital Signs BP 130/73 (BP Location: Right Arm)   Pulse (!) 41   Temp 97.9 F (36.6 C) (Oral)   Resp 14   Ht 1.753 m (5\' 9" )   Wt 79 kg (174 lb 4  oz)   SpO2 100%   BMI 25.73 kg/m   Physical Exam  Constitutional: He is oriented to person, place, and time. He appears well-developed and well-nourished.  Non-toxic appearance. No distress.  HENT:  Head: Normocephalic and atraumatic.  Eyes: Pupils are equal, round, and reactive to light. Conjunctivae, EOM and lids are normal.  Neck: Normal range of motion. Neck supple. No tracheal deviation present. No thyroid mass present.  Cardiovascular: Regular rhythm and normal heart sounds. Bradycardia present. Exam reveals no gallop.  No murmur heard. Pulmonary/Chest: Effort normal and breath sounds normal. No stridor. No respiratory distress. He has no decreased breath sounds. He has no wheezes. He has no rhonchi. He has no rales.  Abdominal: Soft. Normal appearance and bowel sounds are normal. He exhibits no distension. There is no tenderness. There is no rebound and no CVA tenderness.  Musculoskeletal: Normal range of motion. He exhibits no edema or tenderness.  Neurological: He is alert and oriented to person, place, and time. He has normal strength. No cranial nerve deficit or sensory deficit. GCS eye subscore is 4. GCS verbal subscore is 5. GCS motor subscore is 6.  Skin: Skin is warm and dry. No abrasion and no rash noted.  Psychiatric: He has a normal mood and affect. His speech is normal and behavior is normal.  Nursing note and vitals reviewed.    ED Treatments / Results  Labs (all labs ordered are listed, but only abnormal results are displayed) Labs Reviewed  CBC WITH DIFFERENTIAL/PLATELET  BASIC METABOLIC PANEL  TROPONIN I    EKG None  Radiology No results found.  Procedures Procedures (including critical care time)  Medications Ordered in ED Medications  0.9 %  sodium chloride infusion (has no administration in time range)     Initial Impression / Assessment and Plan / ED Course  I have reviewed the triage vital signs and the nursing notes.  Pertinent labs &  imaging results that were available during my care of the patient were reviewed by me and considered in my medical decision making (see chart for details).     Patient presented from radiology after having a systolic arrest which responded to chest compressions.  He was alert and oriented x4 here.  During his stay in the department patient had bradycardia and went asystole which was treated with compressions.  He was then started on glucagon given with IV bolus as well as IV drip.  Was stable with plan for admission however had increased bradycardia but was able to mentate and had good blood pressure.  Was subsequently given  atropine 0.5 mg along with calcium gluconate.  Case discussed with Dr. Toniann Fail who referred the patient to critical care who will come and admit  CRITICAL CARE Performed by: Toy Baker Total critical care time: 60 minutes Critical care time was exclusive of separately billable procedures and treating other patients. Critical care was necessary to treat or prevent imminent or life-threatening deterioration. Critical care was time spent personally by me on the following activities: development of treatment plan with patient and/or surrogate as well as nursing, discussions with consultants, evaluation of patient's response to treatment, examination of patient, obtaining history from patient or surrogate, ordering and performing treatments and interventions, ordering and review of laboratory studies, ordering and review of radiographic studies, pulse oximetry and re-evaluation of patient's condition.   Final Clinical Impressions(s) / ED Diagnoses   Final diagnoses:  None    ED Discharge Orders    None       Lorre Nick, MD 09/23/17 2026

## 2017-09-24 ENCOUNTER — Encounter (HOSPITAL_COMMUNITY): Payer: Self-pay

## 2017-09-24 ENCOUNTER — Observation Stay (HOSPITAL_BASED_OUTPATIENT_CLINIC_OR_DEPARTMENT_OTHER): Payer: Commercial Managed Care - PPO

## 2017-09-24 ENCOUNTER — Other Ambulatory Visit: Payer: Self-pay

## 2017-09-24 DIAGNOSIS — E7800 Pure hypercholesterolemia, unspecified: Secondary | ICD-10-CM | POA: Insufficient documentation

## 2017-09-24 DIAGNOSIS — R112 Nausea with vomiting, unspecified: Secondary | ICD-10-CM | POA: Diagnosis not present

## 2017-09-24 DIAGNOSIS — R001 Bradycardia, unspecified: Secondary | ICD-10-CM | POA: Insufficient documentation

## 2017-09-24 DIAGNOSIS — R931 Abnormal findings on diagnostic imaging of heart and coronary circulation: Secondary | ICD-10-CM | POA: Diagnosis not present

## 2017-09-24 DIAGNOSIS — I469 Cardiac arrest, cause unspecified: Secondary | ICD-10-CM | POA: Diagnosis not present

## 2017-09-24 LAB — CBC
HCT: 40.7 % (ref 39.0–52.0)
Hemoglobin: 13.5 g/dL (ref 13.0–17.0)
MCH: 31 pg (ref 26.0–34.0)
MCHC: 33.2 g/dL (ref 30.0–36.0)
MCV: 93.6 fL (ref 78.0–100.0)
Platelets: 254 10*3/uL (ref 150–400)
RBC: 4.35 MIL/uL (ref 4.22–5.81)
RDW: 13.2 % (ref 11.5–15.5)
WBC: 11.3 10*3/uL — ABNORMAL HIGH (ref 4.0–10.5)

## 2017-09-24 LAB — COMPREHENSIVE METABOLIC PANEL
ALBUMIN: 4.5 g/dL (ref 3.5–5.0)
ALT: 22 U/L (ref 17–63)
ANION GAP: 10 (ref 5–15)
AST: 18 U/L (ref 15–41)
Alkaline Phosphatase: 55 U/L (ref 38–126)
BUN: 14 mg/dL (ref 6–20)
CO2: 23 mmol/L (ref 22–32)
Calcium: 9.3 mg/dL (ref 8.9–10.3)
Chloride: 105 mmol/L (ref 101–111)
Creatinine, Ser: 0.99 mg/dL (ref 0.61–1.24)
GFR calc non Af Amer: >60 mL/min (ref >60)
GLUCOSE: 73 mg/dL (ref 65–99)
POTASSIUM: 4.1 mmol/L (ref 3.5–5.1)
SODIUM: 138 mmol/L (ref 135–145)
TOTAL PROTEIN: 7.6 g/dL (ref 6.5–8.1)
Total Bilirubin: 1.1 mg/dL (ref 0.3–1.2)

## 2017-09-24 LAB — TSH: TSH: 0.447 u[IU]/mL (ref 0.350–4.500)

## 2017-09-24 LAB — ECHOCARDIOGRAM COMPLETE
HEIGHTINCHES: 69 in
WEIGHTICAEL: 2788 [oz_av]

## 2017-09-24 LAB — PHOSPHORUS: PHOSPHORUS: 4.8 mg/dL — AB (ref 2.5–4.6)

## 2017-09-24 LAB — MAGNESIUM: Magnesium: 2 mg/dL (ref 1.7–2.4)

## 2017-09-24 LAB — HIV ANTIBODY (ROUTINE TESTING W REFLEX): HIV SCREEN 4TH GENERATION: NONREACTIVE

## 2017-09-24 LAB — MRSA PCR SCREENING: MRSA BY PCR: NEGATIVE

## 2017-09-24 MED ORDER — ROSUVASTATIN CALCIUM 10 MG PO TABS
10.0000 mg | ORAL_TABLET | Freq: Every evening | ORAL | Status: DC
  Administered 2017-09-24: 10 mg via ORAL
  Filled 2017-09-24: qty 1

## 2017-09-24 MED ORDER — HEPARIN SODIUM (PORCINE) 5000 UNIT/ML IJ SOLN
5000.0000 [IU] | Freq: Three times a day (TID) | INTRAMUSCULAR | Status: DC
  Administered 2017-09-24 (×2): 5000 [IU] via SUBCUTANEOUS
  Filled 2017-09-24 (×3): qty 1

## 2017-09-24 MED ORDER — SODIUM PHOSPHATES 45 MMOLE/15ML IV SOLN
10.0000 mmol | Freq: Once | INTRAVENOUS | Status: AC
  Administered 2017-09-24: 10 mmol via INTRAVENOUS
  Filled 2017-09-24: qty 3.33

## 2017-09-24 MED ORDER — MAGNESIUM SULFATE 2 GM/50ML IV SOLN: 2.0000 g | Freq: Once | INTRAVENOUS | Status: DC

## 2017-09-24 MED ORDER — MAGNESIUM SULFATE 2 GM/50ML IV SOLN
2.0000 g | Freq: Once | INTRAVENOUS | Status: AC
  Administered 2017-09-24: 2 g via INTRAVENOUS
  Filled 2017-09-24: qty 50

## 2017-09-24 NOTE — Progress Notes (Signed)
09/24/2017 1840 Received pt to [id number]-[id number] from 2H.  Pt is A&O, no C/O voiced.  Tele monitor applied and CCMD notified.  Oriented to room, call light and bed.  Call bell in reach, family at bedside. Kathryne HitchAllen, Suvan Stcyr C

## 2017-09-24 NOTE — Progress Notes (Signed)
  Echocardiogram 2D Echocardiogram has been performed.  Celene SkeenVijay  Juleon Narang 09/24/2017, 8:30 AM

## 2017-09-24 NOTE — H&P (Addendum)
Admit date: 09/23/2017 Referring Physician Dr. Lorre NickAnthony Allen Primary Cardiologist Dr. Bryan Lemmaavid Harding Chief complaint/reason for admission:asystolic arrest secondary to lopressor  HPI: Jake Patterson is a 57 y.o. male who is being seen today for the evaluation of asystolic cardiac arrest secondary to beta blocker at the request of Lorre NickAnthony Allen, MD.  This is a 57 year old male followed by Dr. Herbie BaltimoreHarding who has a strong family history of coronary disease but has never had any chest pain or shortness of breath.  He is quite athletic and has a resting heart rate in the 50s at baseline.  Due to a significant family history of coronary disease, coronary calcium score greater than 300 on chest CT as well as coronary calcifications in the LAD and left circumflex, he was scheduled to have a cardiac CT angio done.  He was prescribed Lopressor 50 mg 1 hour prior to his CT which he took.  Heart rate in the office when Dr. Herbie BaltimoreHarding saw the patient was 82 bpm and for best images heart rate needs to be less than 60 bpm during the CT scan and that is why was prescribed Lopressor.  While at his scan he developed progressive bradycardia and then had 30 seconds of asystole.  Chest compressions were started and the patient had immediate return of circulation.  He was transferred immediately to the emergency room where he was neurologically intact and denied any complaints.  Shortly after having his labs drawn he had another episode of asystole for 1 minute.  He had complete return of spontaneous circulation after CPR was again started.  He was given a glucagon infusion as well as atropine which he responded well to.  Currently he feels well without any complaints.  He denies any chest pain or pressure, shortness of breath, dyspnea on exertion, PND, orthopnea.  Heart rate is currently in the 60s on IV dopamine drip.  IV glucagon drip was stopped.  Cardiology is now asked to admit for overnight observation.  PMH:    Past  Medical History:  Diagnosis Date  . BPH (benign prostatic hyperplasia)   . Family history of premature CAD   . Former light tobacco smoker    Less than 1/4 pack/day x 20 years  . Hard of hearing    Left ear  . Hyperlipidemia with target LDL less than 100   . Impaired fasting glucose     PSH:    Past Surgical History:  Procedure Laterality Date  . TYMPANOMASTOIDECTOMY Left 07/19/2017   Procedure: LEFT CANAL WALL TYMPANOMASTOIDECTOMY;  Surgeon: Newman Pieseoh, Su, MD;  Location: [facility];  Service: ENT;  Laterality: Left;  . WISDOM TOOTH EXTRACTION      ALLERGIES:   Metoprolol tartrate  Prior to Admit Meds:   (Not in a hospital admission) Family HX:    Family History  Problem Relation Age of Onset  . Alzheimer's disease Mother        Diet 18  . Coronary artery disease Father [id number]  . Heart attack Father 2748       CABG at age 852  . Coronary artery disease Paternal Grandfather [id number]   Social HX:    Social History   Socioeconomic History  . Marital status: Married    Spouse name: Not on file  . Number of children: 2  . Years of education: Not on file  . Highest education level: Not on file  Occupational History  . Occupation: Garment/textile technologistngineer    Employer: [employer]  Social Needs  . Financial resource strain: Not on file  . Food insecurity:    Worry: Not on file    Inability: Not on file  . Transportation needs:    Medical: Not on file    Non-medical: Not on file  Tobacco Use  . Smoking status: Former Smoker    Packs/day: 0.25    Years: 20.00    Pack years: 5.00    Types: Cigarettes    Start date: 08/13/1980    Last attempt to quit: 08/13/2000    Years since quitting: 17.1  . Smokeless tobacco: Never Used  . Tobacco comment: Quit smoking 2003  Substance and Sexual Activity  . Alcohol use: Yes    Comment: Occass  . Drug use: Never  . Sexual activity: Not on file  Lifestyle  . Physical activity:    Days per week: Not on file    Minutes per session: Not on  file  . Stress: Not on file  Relationships  . Social connections:    Talks on phone: Not on file    Gets together: Not on file    Attends religious service: Not on file    Active member of club or organization: Not on file    Attends meetings of clubs or organizations: Not on file    Relationship status: Not on file  . Intimate partner violence:    Fear of current or ex partner: Not on file    Emotionally abused: Not on file    Physically abused: Not on file    Forced sexual activity: Not on file  Other Topics Concern  . Not on file  Social History Narrative   Is a married father 2 adult daughters   He moved to the [place] from just outside [place] in 1988.   He enjoys cycling, running and up until about a year ago was playing club soccer. -->  He currently exercises about 1/2- 1 1/2 hours every day doing cycling or running.   He works as an Art gallery manager for [employer]   He was a light smoker for about 20 years from 1982 to 2002.   Social alcohol.     ROS:  All ROS were addressed and are negative except what is stated in the HPI  PHYSICAL EXAM Vitals:   09/23/17 2315 09/24/17 0000  BP: 110/71 111/65  Pulse: 69 63  Resp: 14 17  Temp:    SpO2: 99% 98%   General: Well developed, well nourished, in no acute distress Head: Eyes PERRLA, No xanthomas.   Normal cephalic and atramatic  Lungs:   Clear bilaterally to auscultation and percussion. Heart:   HRRR S1 S2 Pulses are 2+ & equal.            No carotid bruit. No JVD.  No abdominal bruits. No femoral bruits. Abdomen: Bowel sounds are positive, abdomen soft and non-tender without masses or                  Hernia's noted. Msk:  Back normal, normal gait. Normal strength and tone for age. Extremities:   No clubbing, cyanosis or edema.  DP +1 Neuro: Alert and oriented X 3. Psych:  Good affect, responds appropriately   Labs:   Lab Results  Component Value Date   WBC 7.9 09/23/2017   HGB 13.0 09/23/2017   HCT 38.7 (L) 09/23/2017    MCV 92.6 09/23/2017   PLT 260 09/23/2017   Recent Labs  Lab 09/23/17 1658  NA  139  K 4.0  CL 103  CO2 27  BUN 12  CREATININE 1.03  CALCIUM 9.3  GLUCOSE 96   Lab Results  Component Value Date   TROPONINI <0.03 09/23/2017   No results found for: PTT No results found for: INR, PROTIME  No results found for: CHOL No results found for: HDL No results found for: LDLCALC No results found for: TRIG No results found for: CHOLHDL No results found for: LDLDIRECT    Radiology:  Dg Chest Port 1 View  Result Date: 09/23/2017 CLINICAL DATA:  Shortness of breath. EXAM: PORTABLE CHEST 1 VIEW COMPARISON:  None. FINDINGS: 1608 hours. The lungs are clear without focal pneumonia, edema, pneumothorax or pleural effusion. Cardiopericardial silhouette is at upper limits of normal for size. The visualized bony structures of the thorax are intact. Telemetry leads overlie the chest. Defibrillator pads overlie the left hemithorax. IMPRESSION: No active disease. Electronically Signed   By: Kennith Center M.D.   On: 09/23/2017 16:16     Telemetry    Normal sinus rhythm at 60 bpm- Personally Reviewed  ECG    EKG #1 shows sinus bradycardia 40 bpm with occasional PVCs.  EKG #2 shows sinus bradycardia at 40 bpm with no ST changes.- Personally Reviewed   ASSESSMENT/PLAN:   1.  Bradycardia/asystolic cardiac arrest -Secondary to large dose of Lopressor given in order to get heart rate less than 60 for coronary CTA. -He had 2 episodes of asystole -the first lasted 30 seconds while in the CT scanner and the second  lasting 1 minute while in [place].  They both responded to a brief episode of compressions as well as 1 g calcium gluconate, atropine, IV dopamine drip and glucagon infusion. -Currently normal sinus rhythm at 60 bpm on IV dopamine infusion -Will admit to the CCU for overnight monitoring on telemetry. -Keep atropine at bedside -Continue IV dopamine drip overnight -Zol pads on chest -TSH is  normal -K is 4 -Mg low at 1.6 - repeat lab  2.  Coronary artery calcifications on chest CT calcium score greater than 300 and calcium noted in LAD and left circumflex -He has a strong family history of coronary disease but he denies any chest pain or shortness of breath.  He is very active athletically and has had no anginal symptoms. -trop neg x 2 -He needs aggressive risk factor modification. -Continue on statin therapy with Crestor 10 mg daily and consider addition of aspirin 81 mg daily.  3.  Hyperlipidemia - continue statin  4.  Nausea and vomiting -Likely related to glucagon as well as bradycardia -Improved with Compazine and stopping glucagon drip  Armanda Magic, MD  09/24/2017  12:05 AM

## 2017-09-24 NOTE — Progress Notes (Addendum)
Progress Note  Patient Name: Jake Patterson Date of Encounter: 09/24/2017  Primary Cardiologist: No primary care provider on file.   Subjective   He will has episodes of bradycardia below his heart rate noted on telemetry since last night is 43 bpm and when he is talking his heart rate goes up to 73 bpm.  Patient has no complaints of chest pain, shortness of breath, dizziness.  Inpatient Medications    Scheduled Meds: . heparin  5,000 Units Subcutaneous Q8H  . rosuvastatin  10 mg Oral QPM   Continuous Infusions: . sodium chloride 20 mL/hr at 09/24/17 1500  . DOPamine 4.996 mcg/kg/min (09/24/17 1500)   PRN Meds: prochlorperazine   Vital Signs    Vitals:   09/24/17 1330 09/24/17 1400 09/24/17 1430 09/24/17 1500  BP: 110/71 110/65 103/65 112/67  Pulse: (!) 53 (!) 52 (!) 55 (!) 57  Resp: 14 12 11 11   Temp:      TempSrc:      SpO2: 99% 97% (!) 87% 94%  Weight:      Height:        Intake/Output Summary (Last 24 hours) at 09/24/2017 1618 Last data filed at 09/24/2017 1500 Gross per 24 hour  Intake 1336.16 ml  Output 675 ml  Net 661.16 ml   Filed Weights   09/23/17 1548  Weight: 174 lb 4 oz (79 kg)    Telemetry    Sinus bradycardia and normal sinus rhythm with heart rates on telemetry anywhere from 43 to 71 bpm with occasional PVCs- Personally Reviewed  ECG    No new EKG to review- Personally Reviewed  Physical Exam   GEN: No acute distress.   Neck: No JVD Cardiac: RRR, no murmurs, rubs, or gallops.  Occasional ectopy noted Respiratory: Clear to auscultation bilaterally. GI: Soft, nontender, non-distended  MS: No edema; No deformity. Neuro:  Nonfocal  Psych: Normal affect   Labs    Chemistry Recent Labs  Lab 09/23/17 1658 09/24/17 0344  NA 139 138  K 4.0 4.1  CL 103 105  CO2 27 23  GLUCOSE 96 73  BUN 12 14  CREATININE 1.03 0.99  CALCIUM 9.3 9.3  PROT  --  7.6  ALBUMIN  --  4.5  AST  --  18  ALT  --  22  ALKPHOS  --  55  BILITOT  --   1.1  GFRNONAA >60 >60  GFRAA >60 >60  ANIONGAP 9 10     Hematology Recent Labs  Lab 09/23/17 1658 09/24/17 0344  WBC 7.9 11.3*  RBC 4.18* 4.35  HGB 13.0 13.5  HCT 38.7* 40.7  MCV 92.6 93.6  MCH 31.1 31.0  MCHC 33.6 33.2  RDW 13.1 13.2  PLT 260 254    Cardiac Enzymes Recent Labs  Lab 09/23/17 1658 09/23/17 2244  TROPONINI <0.03 <0.03   No results for input(s): TROPIPOC in the last 168 hours.   BNPNo results for input(s): BNP, PROBNP in the last 168 hours.   DDimer No results for input(s): DDIMER in the last 168 hours.   Radiology    Dg Chest Port 1 View  Result Date: 09/23/2017 CLINICAL DATA:  Shortness of breath. EXAM: PORTABLE CHEST 1 VIEW COMPARISON:  None. FINDINGS: 1608 hours. The lungs are clear without focal pneumonia, edema, pneumothorax or pleural effusion. Cardiopericardial silhouette is at upper limits of normal for size. The visualized bony structures of the thorax are intact. Telemetry leads overlie the chest. Defibrillator pads overlie the left hemithorax.  IMPRESSION: No active disease. Electronically Signed   By: Kennith Center M.D.   On: 09/23/2017 16:16    Cardiac Studies   2D echo 09/24/2017 Study Conclusions  - Left ventricle: The cavity size was normal. Wall thickness was   normal. Systolic function was normal. The estimated ejection   fraction was in the range of 60% to 65%. Wall motion was normal;   there were no regional wall motion abnormalities. - Ventricular septum: The contour showed diastolic flattening. - Right ventricle: The cavity size was mildly dilated. - Right atrium: The atrium was mildly dilated.  Patient Profile     57 y.o. male a strong family history of coronary disease but has never had any chest pain or shortness of breath.  He is quite athletic and has a resting heart rate in the 50s at baseline.  Due to a significant family history of coronary disease, coronary calcium score greater than 300 on chest CT as well as  coronary calcifications in the LAD and left circumflex, he was scheduled to have a cardiac CT angio done.  He was prescribed Lopressor 50 mg 1 hour prior to his CT which he took.  Heart rate in the office when Dr. Herbie Baltimore saw the patient was 82 bpm and for best images heart rate needs to be less than 60 bpm during the CT scan and that is why was prescribed Lopressor.  While at his scan he developed progressive bradycardia and then had 30 seconds of asystole.  Chest compressions were started and the patient had immediate return of circulation.  He was transferred immediately to the emergency room where he was neurologically intact and denied any complaints.  Shortly after having his labs drawn he had another episode of asystole for 1 minute.  He had complete return of spontaneous circulation after CPR was again started.  He was given a glucagon infusion as well as atropine which he responded well to.  Assessment & Plan    ASSESSMENT/PLAN:   1.  Bradycardia/asystolic cardiac arrest -Secondary to large dose of Lopressor given in order to get heart rate less than 60 for coronary CTA. -He had 2 episodes of asystole -the first lasted 30 seconds while in the CT scanner and the second  lasting 1 minute while in [place].  They both responded to a brief episode of compressions as well as 1 g calcium gluconate, atropine, IV dopamine drip and glucagon infusion. -Still bradycardiac at times with heart rate on telemetry anywhere from 43 to 71 bpm with occasional PVCs -2D echo showed normal LV systolic function with EF 60 to 65% -TSH is normal -K is 4.1 and magnesium 2 -Stop IV dopamine drip and transfer to telemetry -Monitor on telemetry 24 more hours prior to discharge  2.  Coronary artery calcifications on chest CT calcium score greater than 300 and calcium noted in LAD and left circumflex -He has a strong family history of coronary disease but he denies any chest pain or shortness of breath.  He is very active  athletically and has had no anginal symptoms. -trop neg x 2 -He needs aggressive risk factor modification. -Continue on statin therapy with Crestor 10 mg daily and consider addition of aspirin 81 mg daily.  3.  Hyperlipidemia - continue statin  4.  Nausea and vomiting -Likely related to glucagon as well as bradycardia -Improved with Compazine and stopping glucagon drip -He has had no further nausea and vomiting  5.  Mildly dilated RV and RA with  diastolic contour to the interventricular septum possibly secondary to right-sided volume overload.    For questions or updates, please contact [facility] Please consult [email or web address] for contact info under Cardiology/STEMI.      Signed, Armanda Magic, MD  09/24/2017, 4:18 PM

## 2017-09-24 NOTE — Plan of Care (Signed)
Patient received from ER via stretcher.  Remains in sinus brady with occasional PVCs.  Placed on Zoll, and monitor in room.  Dopamine infusing without any issues at this time.  Patient denies any pain or discomfort, no dizziness, or shortness of breath.  Discussed events leading to admission.  Explained expectations, reasons for maintaining NPO status at this time, and medications.  Monitoring.

## 2017-09-24 NOTE — ED Notes (Signed)
Report given to June RN on [facility]

## 2017-09-25 ENCOUNTER — Encounter (HOSPITAL_COMMUNITY): Payer: Self-pay | Admitting: Cardiology

## 2017-09-25 DIAGNOSIS — I469 Cardiac arrest, cause unspecified: Secondary | ICD-10-CM | POA: Diagnosis not present

## 2017-09-25 HISTORY — DX: Cardiac arrest, cause unspecified: I46.9

## 2017-09-25 MED ORDER — ROSUVASTATIN CALCIUM 20 MG PO TABS: 10.0000 mg | ORAL_TABLET | Freq: Every day | ORAL | 11 refills | Status: AC

## 2017-09-25 NOTE — Discharge Instructions (Signed)
Heart Healthy Diet   We increased your Crestor to 20 mg.  Call if any problems or questions.

## 2017-09-25 NOTE — Progress Notes (Signed)
Progress Note  Patient Name: Jake Patterson Date of Encounter: 09/25/2017  Primary Cardiologist:   Bryan Lemma, MD   Subjective   No chest pain.  No SOB.    Inpatient Medications    Scheduled Meds: . heparin  5,000 Units Subcutaneous Q8H  . rosuvastatin  10 mg Oral QPM   Continuous Infusions:  PRN Meds: prochlorperazine   Vital Signs    Vitals:   09/24/17 1841 09/24/17 1853 09/24/17 2109 09/25/17 0618  BP:  106/72 128/73 130/63  Pulse: 70 (!) 52  (!) 45  Resp:  11 15 10   Temp: 98.1 F (36.7 C)  98.3 F (36.8 C) 97.7 F (36.5 C)  TempSrc: Oral  Oral Oral  SpO2: 98% 97% 100% 97%  Weight:      Height:        Intake/Output Summary (Last 24 hours) at 09/25/2017 0929 Last data filed at 09/25/2017 0300 Gross per 24 hour  Intake 791.8 ml  Output 1325 ml  Net -533.2 ml   Filed Weights   09/23/17 1548  Weight: 174 lb 4 oz (79 kg)    Telemetry    NSR, PACs, PVCs, asymptomatic SB - Personally Reviewed  ECG    NA - Personally Reviewed  Physical Exam   GEN: No acute distress.   Neck: No  JVD Cardiac: RRR, no murmurs, rubs, or gallops.  Respiratory: Clear  to auscultation bilaterally. GI: Soft, nontender, non-distended  MS: No  edema; No deformity. Neuro:  Nonfocal  Psych: Normal affect   Labs    Chemistry Recent Labs  Lab 09/23/17 1658 09/24/17 0344  NA 139 138  K 4.0 4.1  CL 103 105  CO2 27 23  GLUCOSE 96 73  BUN 12 14  CREATININE 1.03 0.99  CALCIUM 9.3 9.3  PROT  --  7.6  ALBUMIN  --  4.5  AST  --  18  ALT  --  22  ALKPHOS  --  55  BILITOT  --  1.1  GFRNONAA >60 >60  GFRAA >60 >60  ANIONGAP 9 10     Hematology Recent Labs  Lab 09/23/17 1658 09/24/17 0344  WBC 7.9 11.3*  RBC 4.18* 4.35  HGB 13.0 13.5  HCT 38.7* 40.7  MCV 92.6 93.6  MCH 31.1 31.0  MCHC 33.6 33.2  RDW 13.1 13.2  PLT 260 254    Cardiac Enzymes Recent Labs  Lab 09/23/17 1658 09/23/17 2244  TROPONINI <0.03 <0.03   No results for input(s): TROPIPOC  in the last 168 hours.   BNPNo results for input(s): BNP, PROBNP in the last 168 hours.   DDimer No results for input(s): DDIMER in the last 168 hours.   Radiology    Dg Chest Port 1 View  Result Date: 09/23/2017 CLINICAL DATA:  Shortness of breath. EXAM: PORTABLE CHEST 1 VIEW COMPARISON:  None. FINDINGS: 1608 hours. The lungs are clear without focal pneumonia, edema, pneumothorax or pleural effusion. Cardiopericardial silhouette is at upper limits of normal for size. The visualized bony structures of the thorax are intact. Telemetry leads overlie the chest. Defibrillator pads overlie the left hemithorax. IMPRESSION: No active disease. Electronically Signed   By: Kennith Center M.D.   On: 09/23/2017 16:16    Cardiac Studies   2D echo 09/24/2017 Study Conclusions  - Left ventricle: The cavity size was normal. Wall thickness was normal. Systolic function was normal. The estimated ejection fraction was in the range of 60% to 65%. Wall motion was normal; there  were no regional wall motion abnormalities. - Ventricular septum: The contour showed diastolic flattening. - Right ventricle: The cavity size was mildly dilated. - Right atrium: The atrium was mildly dilated.    Patient Profile    57 y.o. male    who is being seen for the evaluation of asystolic cardiac arrest secondary to beta blocker at the request of Lorre NickAnthony Allen, MD.  He was being evaluated for coronary calcium.    Assessment & Plan    BRADYCARDIA:  OK to go home.  Follow up with Dr. Hadley PenHardning.   CAD:  I had a discussion with the patient about further imaging.  Dr. Herbie BaltimoreHarding will need to consider further attempts and defining the extent of disease with CT or cath.  However, he is asymptomatic.  I might suggest a functional study such as a POET (Plain Old Exercise Treadmill) and if negative continue medical management with aggressive risk reduction.  DYSLIPIDEMIA:  I think he should be on moderate dose statin.  Change  to 20 mg Crestor at discharge.   LDL was 130.    For questions or updates, please contact [facility] Please consult [email or web address] for contact info under Cardiology/STEMI.   Signed, Rollene RotundaJames Matti Killingsworth, MD  09/25/2017, 9:29 AM

## 2017-09-25 NOTE — Discharge Summary (Signed)
Discharge Summary    Patient ID: Jake Patterson,  MRN: [id number], DOB/AGE: Apr 06, 1961 57 y.o.  Admit date: 09/23/2017 Discharge date: 09/25/2017  Primary Care Provider: Martha Clan Primary Cardiologist: Rollene Rotunda, MD-change per Pt. And Dr. Daiva Nakayama   Discharge Diagnoses    Principal Problem:   Asystole [facility]) Active Problems:   Coronary artery calcification seen on CAT scan -> Elevated coronary calcium score   Hyperlipidemia with target LDL less than 100   Cardiac arrest (HCC)   Allergies Allergies  Allergen Reactions  . Metoprolol Tartrate Other (See Comments)    Patient's heart "flatlined" and had to receive CPR    Diagnostic Studies/Procedures    TTE  09/24/17   Study Conclusions  - Left ventricle: The cavity size was normal. Wall thickness was   normal. Systolic function was normal. The estimated ejection   fraction was in the range of 60% to 65%. Wall motion was normal;   there were no regional wall motion abnormalities. - Ventricular septum: The contour showed diastolic flattening. - Right ventricle: The cavity size was mildly dilated. - Right atrium: The atrium was mildly dilated.  _____________   History of Present Illness     63 yom with hx of premature CAD, + tobacco, and HLD was having a Cardiac CTA for his significant FH CAD and he took lopressor 50 mg 1 hours prior to procedure as instructed.  He developed progressive bradycardia, then asystole 30 sec and chest compressions were started and he had immediate return of circulation.    In ER he had normal neuro exam and no chest pain.    With lab draw in ER he had a repeat of asystole 1 min. And ROSC after the one minute.  He was started on glucagon drip for recurrent bradycardia.  Pt's baseline HR he stated was 53.  On glucagon he developed N/V.   He was admitted to the hospital for monitoring and further treatment.    Hospital Course     Consultants: Cardiology    After admit pt was  seen by Cardiology.  Pt;s Thyroid was stable,  CXR with no active disease.  TTE with normal EF.  The glucogaon was changed to dopamine to keep HR elevated.  Pt's K+ was 4, Mg 2.0.    By the next day his dopamine was stopped and HR from 43 to 71.  He was transferred to tele and HR remained stable.     His cardiac CT calcium score > 300 and calcium noted in LAD and LCX.  No symptoms.  troponins have been neg. Plan for aggressive risk factor modification.  He may need POET as outpt.  His N/V resolved.  For his LDL of 130 his crestor was increased to 20 mg.   He has been seen and evaluated  _____________  Discharge Vitals Blood pressure 130/63, pulse (!) 45, temperature 97.7 F (36.5 C), temperature source Oral, resp. rate 10, height 5\' 9"  (1.753 m), weight 174 lb 4 oz (79 kg), SpO2 97 %.  Filed Weights   09/23/17 1548  Weight: 174 lb 4 oz (79 kg)    Labs & Radiologic Studies    CBC Recent Labs    09/23/17 1658 09/24/17 0344  WBC 7.9 11.3*  NEUTROABS 5.3  --   HGB 13.0 13.5  HCT 38.7* 40.7  MCV 92.6 93.6  PLT 260 254   Basic Metabolic Panel Recent Labs    40/98/11 1658 09/23/17 2244 09/24/17 0344  NA 139  --  138  K 4.0  --  4.1  CL 103  --  105  CO2 27  --  23  GLUCOSE 96  --  73  BUN 12  --  14  CREATININE 1.03  --  0.99  CALCIUM 9.3  --  9.3  MG  --  1.6* 2.0  PHOS  --  2.1* 4.8*   Liver Function Tests Recent Labs    09/24/17 0344  AST 18  ALT 22  ALKPHOS 55  BILITOT 1.1  PROT 7.6  ALBUMIN 4.5   No results for input(s): LIPASE, AMYLASE in the last 72 hours. Cardiac Enzymes Recent Labs    09/23/17 1658 09/23/17 2244  TROPONINI <0.03 <0.03   BNP Invalid input(s): POCBNP D-Dimer No results for input(s): DDIMER in the last 72 hours. Hemoglobin A1C No results for input(s): HGBA1C in the last 72 hours. Fasting Lipid Panel No results for input(s): CHOL, HDL, LDLCALC, TRIG, CHOLHDL, LDLDIRECT in the last 72 hours. Thyroid Function Tests Recent Labs     09/24/17 0345  TSH 0.447   _____________  Dg Chest Port 1 View  Result Date: 09/23/2017 CLINICAL DATA:  Shortness of breath. EXAM: PORTABLE CHEST 1 VIEW COMPARISON:  None. FINDINGS: 1608 hours. The lungs are clear without focal pneumonia, edema, pneumothorax or pleural effusion. Cardiopericardial silhouette is at upper limits of normal for size. The visualized bony structures of the thorax are intact. Telemetry leads overlie the chest. Defibrillator pads overlie the left hemithorax. IMPRESSION: No active disease. Electronically Signed   By: Kennith Center M.D.   On: 09/23/2017 16:16   Disposition   Pt is being discharged home today in good condition.  Follow-up Plans & Appointments   Heart Healthy Diet   We increased your Crestor to 20 mg.  Call if any problems or questions.   Follow-up Information    Rollene Rotunda, MD Follow up.   Specialty:  Cardiology Why:  the office should call you Monday for a follow up appt in 10 days, if you have not heard from them call please.   Contact information: [address] [place] [address] [phone]            Discharge Medications   Allergies as of 09/25/2017      Reactions   Metoprolol Tartrate Other (See Comments)   Patient's heart "flatlined" and had to receive CPR      Medication List    STOP taking these medications   metoprolol tartrate 50 MG tablet Commonly known as:  LOPRESSOR     TAKE these medications   CENTRUM SILVER PO Take 1 tablet by mouth 3 (three) times a week.   FISH OIL PO Take 2 capsules by mouth daily.   oxyCODONE-acetaminophen 5-325 MG tablet Commonly known as:  PERCOCET/ROXICET Take 1-2 tablets by mouth every 4 (four) hours as needed for severe pain.   PROBIOTIC DAILY PO Take 1 capsule by mouth daily.   rosuvastatin 20 MG tablet Commonly known as:  CRESTOR Take 0.5 tablets (10 mg total) by mouth at bedtime. What changed:    medication strength  when to take this     SAW PALMETTO PO Take 1 capsule by mouth daily.   sildenafil 20 MG tablet Commonly known as:  REVATIO Take 20 mg by mouth daily as needed (for ED).        Acute coronary syndrome (MI, NSTEMI, STEMI, etc) this admission?: No.    Outstanding Labs/Studies  none  Duration of Discharge Encounter   Greater than 30 minutes including physician time.  Signed, Sharlotte AlamoLaura Orlandria Kissner Early Ord, NP 09/25/2017, 11:25 AM

## 2017-09-25 NOTE — Progress Notes (Signed)
09/25/2017 1245 Discharge AVS meds taken today and those due this evening reviewed.  Follow-up appointments and when to call md reviewed.  D/C IV and TELE.  Questions and concerns addressed.   D/C home per orders. Kathryne HitchAllen, Mort Smelser C

## 2017-09-27 ENCOUNTER — Telehealth: Payer: Self-pay | Admitting: *Deleted

## 2017-09-27 NOTE — Telephone Encounter (Signed)
Returned the call to the patient. He was recently discharged from the hospital after developing bradycardia and asystole x 2 after taking lopressor 50 mg prior to a cardiac ct.  He stated that since discharge he has been having feelings of fatigue and dizziness when ambulating. He denies chest pain and shortness of breath. He stated that if he does short periods of movement then he can control the dizziness. It is only when he stands for long periods of time that the dizziness occurs.   He is unable to check his blood pressure at him but does wear a fit bit. He stated that his heart rate has been staying in the 50-70's and 40's when he is asleep. While on the phone he got up and walked around and his heart rate was 70. He has also been staying hydrated.   He has a follow up on 6/20 with Dr. Antoine PocheHochrein. He has been instructed to rest and take his time when he's up. He has also been advised that he should go to the ED if his symptoms progress. Message routed to the provider for further recommendation.

## 2017-09-27 NOTE — Telephone Encounter (Signed)
Called patient to schedule post hospital visit---he states he has been light headed and dizzy for the past couple of days.  Please call.

## 2017-09-28 NOTE — Telephone Encounter (Signed)
Agree.  I will see him on Friday.

## 2017-09-29 NOTE — Progress Notes (Signed)
Cardiology Office Note   Date:  09/30/2017   ID:  Jake KicksDavid N Necaise, DOB 01/14/1961, MRN [id number]  PCP:  Martha ClanShaw, William, MD  Cardiologist:   Rollene RotundaJames Charlesia Canaday, MD   Chief Complaint  Patient presents with  . Dizziness      History of Present Illness: Jake KicksDavid N Stanard is a 57 y.o. male who presents for follow up of bradycardia.  He has coronary calcium and was to have a coronary CTA.  However, when he was given beta blockers he had bradycardia and asystolic x 30 days.  He actually required chest compression.  He had a repeat episode with a blood draw.   He was treated with glucagon and dopamine.  Echo was unremarkable.  Since discharge he did call with fatigue.   He records was been happening.  He has been waking up tired.  He is had some dizziness.  He has had some problems when he is bending over and getting some lightheadedness.  His heart rate has been fine.  Blood pressures when he got a blood pressure cuff were fine.  Confounding the situation is that he had an ear procedure about 3 months ago where he describes that remove the bones in his ear and had to replace his eardrum.  His wife said he was told he would get dizziness.  He has not had any frank syncope or other symptoms like he had at the time of his events in the hospital.  He has not had any chest discomfort, neck or arm discomfort.  He is not feeling palpitations.  He is had no new shortness of breath, PND or orthopnea.   Past Medical History:  Diagnosis Date  . BPH (benign prostatic hyperplasia)   . Cardiac arrest (HCC) 09/25/2017  . Family history of premature CAD   . Former light tobacco smoker    Less than 1/4 pack/day x 20 years  . Hard of hearing    Left ear  . Hyperlipidemia with target LDL less than 100   . Impaired fasting glucose     Past Surgical History:  Procedure Laterality Date  . TYMPANOMASTOIDECTOMY Left 07/19/2017   Procedure: LEFT CANAL WALL TYMPANOMASTOIDECTOMY;  Surgeon: Newman Pieseoh, Su, MD;  Location: [facility];  Service: ENT;  Laterality: Left;  . WISDOM TOOTH EXTRACTION       Current Outpatient Medications  Medication Sig Dispense Refill  . Multiple Vitamins-Minerals (CENTRUM SILVER PO) Take 1 tablet by mouth 3 (three) times a week.     . Omega-3 Fatty Acids (FISH OIL PO) Take 2 capsules by mouth daily.     . Probiotic Product (PROBIOTIC DAILY PO) Take 1 capsule by mouth daily.     . rosuvastatin (CRESTOR) 20 MG tablet Take 0.5 tablets (10 mg total) by mouth at bedtime. 30 tablet 11  . Saw Palmetto, Serenoa repens, (SAW PALMETTO PO) Take 1 capsule by mouth daily.     . sildenafil (REVATIO) 20 MG tablet Take 20 mg by mouth daily as needed (for ED).      No current facility-administered medications for this visit.     Allergies:   Metoprolol tartrate    ROS:  Please see the history of present illness.   Otherwise, review of systems are positive for none.   All other systems are reviewed and negative.    PHYSICAL EXAM: VS:  BP 115/82   Pulse (!) 55   Ht 5\' 9"  (1.753 m)   Wt 174 lb (78.9  kg)   BMI 25.70 kg/m  , BMI Body mass index is 25.7 kg/m. GENERAL:  Well appearing NECK:  No jugular venous distention, waveform within normal limits, carotid upstroke brisk and symmetric, no bruits, no thyromegaly LUNGS:  Clear to auscultation bilaterall CHEST:  Unremarkable HEART:  PMI not displaced or sustained,S1 and S2 within normal limits, no S3, no S4, no clicks, no rubs, no murmurs ABD:  Flat, positive bowel sounds normal in frequency in pitch, no bruits, no rebound, no guarding, no midline pulsatile mass, no hepatomegaly, no splenomegaly EXT:  2 plus pulses throughout, no edema, no cyanosis no clubbing   EKG:  EKG is ordered today. The ekg ordered today demonstrates sinus rhythm, rate 55, axis within normal limits, intervals within normal limits, no acute ST-T wave changes.   Recent Labs: 09/24/2017: ALT 22; BUN 14; Creatinine, Ser 0.99; Hemoglobin 13.5; Magnesium 2.0;  Platelets 254; Potassium 4.1; Sodium 138; TSH 0.447    Lipid Panel No results found for: CHOL, TRIG, HDL, CHOLHDL, VLDL, LDLCALC, LDLDIRECT    Wt Readings from Last 3 Encounters:  09/30/17 174 lb (78.9 kg)  09/23/17 174 lb 4 oz (79 kg)  08/11/17 178 lb (80.7 kg)      Other studies Reviewed: Additional studies/ records that were reviewed today include: None. Review of the above records demonstrates:  Please see elsewhere in the note.     ASSESSMENT AND PLAN:  BRADYCARDIA:    He has a baseline resting heart rate that is low.  However, I really do not think this is causing any of his symptoms.  The event described above was related to beta-blocker and reversed easily with treatment of this.  He is going to go back and see his ear Dr. and if he continues to have the symptoms without any clear explanation provided by them I might apply or other monitor.  ELEVATED CORONARY CALCIUM: He has some ventricular ectopy we do not want to try to get a coronary CTA again.  Rather I will order a functional study and he will have an exercise Myoview.  DYSLIPIDEMIA:  His MESA score was 11.  He is now on statin and should get a lipid profile again in about 8 weeks.  We talked about the controversy of primary prevention ASA.     Current medicines are reviewed at length with the patient today.  The patient does not have concerns regarding medicines.  The following changes have been made:  no change  Labs/ tests ordered today include:   Orders Placed This Encounter  Procedures  . MYOCARDIAL PERFUSION IMAGING  . EKG 12-Lead     Disposition:   FU with me in one month.     Signed, Rollene Rotunda, MD  09/30/2017 5:22 PM    [facility]

## 2017-09-30 ENCOUNTER — Encounter: Payer: Self-pay | Admitting: Cardiology

## 2017-09-30 ENCOUNTER — Ambulatory Visit (INDEPENDENT_AMBULATORY_CARE_PROVIDER_SITE_OTHER): Payer: Commercial Managed Care - PPO | Admitting: Cardiology

## 2017-09-30 VITALS — BP 115/82 | HR 55 | Ht 69.0 in | Wt 174.0 lb

## 2017-09-30 DIAGNOSIS — I2510 Atherosclerotic heart disease of native coronary artery without angina pectoris: Secondary | ICD-10-CM

## 2017-09-30 DIAGNOSIS — R001 Bradycardia, unspecified: Secondary | ICD-10-CM

## 2017-09-30 DIAGNOSIS — E785 Hyperlipidemia, unspecified: Secondary | ICD-10-CM | POA: Diagnosis not present

## 2017-09-30 DIAGNOSIS — R42 Dizziness and giddiness: Secondary | ICD-10-CM | POA: Diagnosis not present

## 2017-09-30 NOTE — Telephone Encounter (Signed)
Pt have appt with Dr Antoine PocheHochrein today

## 2017-09-30 NOTE — Patient Instructions (Signed)
Medication Instructions:  Continue current medications  If you need a refill on your cardiac medications before your next appointment, please call your pharmacy.  Labwork: None Ordered   Testing/Procedures: Your physician has requested that you have en exercise stress myoview. For further information please visit [email or web address]. Please follow instruction sheet, as given.  Follow-Up: Your physician wants you to follow-up in: 1 Month.      Thank you [facility] choosing [facility]!!

## 2017-10-04 ENCOUNTER — Encounter: Payer: Self-pay | Admitting: Cardiology

## 2017-10-06 ENCOUNTER — Telehealth: Payer: Self-pay | Admitting: Cardiology

## 2017-10-06 NOTE — Telephone Encounter (Signed)
Spoke with pt's wife who states pt is needing a letter specifying that he is ok to return to work. She reports pt has received a letter from HR stating he can not report back to work until one is provided Routing to Dr. Norwich LionsHochrien and Nurse.

## 2017-10-06 NOTE — Telephone Encounter (Signed)
New Message    Patients wife is calling on behalf of spouse. He is needing a letter that clears him to return to work. Please call to discuss.

## 2017-10-06 NOTE — Telephone Encounter (Signed)
Please send a letter saying that the patient can go back to work.

## 2017-10-07 ENCOUNTER — Encounter: Payer: Self-pay | Admitting: Cardiology

## 2017-10-07 NOTE — Telephone Encounter (Signed)
New Message:       Pt's wife is calling and states the pt needs the letter faxed to [phone].

## 2017-10-07 NOTE — Telephone Encounter (Signed)
Spoke with pt, who stated he need a letter stating it ok to work, pt return to work on June 24th, but stated his manager wanted a work from his cardiologist stating that. Letter written and faxed.

## 2017-10-08 ENCOUNTER — Telehealth (HOSPITAL_COMMUNITY): Payer: Self-pay

## 2017-10-08 NOTE — Telephone Encounter (Signed)
Encounter complete. 

## 2017-10-11 ENCOUNTER — Ambulatory Visit: Payer: Commercial Managed Care - PPO | Admitting: Cardiology

## 2017-10-13 ENCOUNTER — Ambulatory Visit (HOSPITAL_COMMUNITY)
Admission: RE | Admit: 2017-10-13 | Discharge: 2017-10-13 | Disposition: A | Discharge status: Home / Self Care | Payer: Commercial Managed Care - PPO | Source: Ambulatory Visit | Attending: Cardiology | Admitting: Cardiology

## 2017-10-13 DIAGNOSIS — I493 Ventricular premature depolarization: Secondary | ICD-10-CM | POA: Insufficient documentation

## 2017-10-13 DIAGNOSIS — I2510 Atherosclerotic heart disease of native coronary artery without angina pectoris: Secondary | ICD-10-CM | POA: Diagnosis not present

## 2017-10-13 LAB — MYOCARDIAL PERFUSION IMAGING
CHL CUP MPHR: 164 {beats}/min
CHL CUP RESTING HR STRESS: 52 {beats}/min
CHL RATE OF PERCEIVED EXERTION: 17
CSEPED: 9 min
CSEPHR: 107 %
Estimated workload: 11.4 METS
Exercise duration (sec): 51 s
LV sys vol: 90 mL
LVDIAVOL: 114 mL (ref 62–150)
Peak HR: 176 {beats}/min
SDS: 0
SRS: 0
SSS: 0
TID: 0.94

## 2017-10-13 MED ORDER — TECHNETIUM TC 99M TETROFOSMIN IV KIT
29.5000 | PACK | Freq: Once | INTRAVENOUS | Status: AC | PRN
  Administered 2017-10-13: 29.5 via INTRAVENOUS
  Filled 2017-10-13: qty 30

## 2017-10-13 MED ORDER — TECHNETIUM TC 99M TETROFOSMIN IV KIT
10.4000 | PACK | Freq: Once | INTRAVENOUS | Status: AC | PRN
  Administered 2017-10-13: 10.4 via INTRAVENOUS
  Filled 2017-10-13: qty 11

## 2017-10-13 NOTE — Progress Notes (Signed)
ECG is abnormal; but Images look good.  No sign of large Vessel ischemia or ischemia Inaccurate assessment of cardiac function due to PVCs -- Echo EF was normal (this is more accurate)  Jake Patterson,Alesandro W, MD Covering for Dr. Antoine PocheHochrein

## 2017-11-14 NOTE — Progress Notes (Signed)
Cardiology Office Note   Date:  11/16/2017   ID:  Jake Patterson, DOB 10/22/1960, MRN [id number]  PCP:  Martha ClanShaw, William, MD  Cardiologist:   Rollene RotundaJames Chaos Carlile, MD   No chief complaint on file.     History of Present Illness: Jake KicksDavid N Patterson is a 57 y.o. male who presents for follow up of bradycardia.  He has coronary calcium and was to have a coronary CTA.  However, when he was given beta blockers he had bradycardia and asystolic x 30 days.  He actually required chest compression.  He had a repeat episode with a blood draw.   He was treated with glucagon and dopamine.  Echo was unremarkable.  He returns for follow up.  He had a negative perfusion study.      Since I last saw her she has done well.  The patient denies any new symptoms such as chest discomfort, neck or arm discomfort. There has been no new shortness of breath, PND or orthopnea. There have been no reported palpitations, presyncope or syncope.  He is very active and athletic.  He did reduce his statin to every other day because it was causing him to have forgetfulness and some muscle aching.  He feels much better since doing this.  Past Medical History:  Diagnosis Date  . BPH (benign prostatic hyperplasia)   . Cardiac arrest (HCC) 09/25/2017  . Family history of premature CAD   . Former light tobacco smoker    Less than 1/4 pack/day x 20 years  . Hard of hearing    Left ear  . Hyperlipidemia with target LDL less than 100   . Impaired fasting glucose     Past Surgical History:  Procedure Laterality Date  . TYMPANOMASTOIDECTOMY Left 07/19/2017   Procedure: LEFT CANAL WALL TYMPANOMASTOIDECTOMY;  Surgeon: Newman Pieseoh, Su, MD;  Location: [facility];  Service: ENT;  Laterality: Left;  . WISDOM TOOTH EXTRACTION       Current Outpatient Medications  Medication Sig Dispense Refill  . Multiple Vitamins-Minerals (CENTRUM SILVER PO) Take 1 tablet by mouth 3 (three) times a week.     . Omega-3 Fatty Acids (FISH OIL PO)  Take 2 capsules by mouth daily.     . Probiotic Product (PROBIOTIC DAILY PO) Take 1 capsule by mouth daily.     . rosuvastatin (CRESTOR) 20 MG tablet Take 0.5 tablets (10 mg total) by mouth at bedtime. 30 tablet 11  . Saw Palmetto, Serenoa repens, (SAW PALMETTO PO) Take 1 capsule by mouth daily.     . sildenafil (REVATIO) 20 MG tablet Take 20 mg by mouth daily as needed (for ED).      No current facility-administered medications for this visit.     Allergies:   Metoprolol tartrate    ROS:  Please see the history of present illness.   Otherwise, review of systems are positive for none.   All other systems are reviewed and negative.    PHYSICAL EXAM: VS:  BP 123/77   Pulse (!) 56   Ht 5\' 9"  (1.753 m)   Wt 173 lb 12.8 oz (78.8 kg)   SpO2 97%   BMI 25.67 kg/m  , BMI Body mass index is 25.67 kg/m.  GENERAL:  Well appearing NECK:  No jugular venous distention, waveform within normal limits, carotid upstroke brisk and symmetric, no bruits, no thyromegaly LUNGS:  Clear to auscultation bilaterally CHEST:  Unremarkable HEART:  PMI not displaced or sustained,S1 and S2 within  normal limits, no S3, no S4, no clicks, no rubs, no murmurs ABD:  Flat, positive bowel sounds normal in frequency in pitch, no bruits, no rebound, no guarding, no midline pulsatile mass, no hepatomegaly, no splenomegaly EXT:  2 plus pulses throughout, no edema, no cyanosis no clubbing    EKG:  EKG is not ordered today.   Recent Labs: 09/24/2017: ALT 22; BUN 14; Creatinine, Ser 0.99; Hemoglobin 13.5; Magnesium 2.0; Platelets 254; Potassium 4.1; Sodium 138; TSH 0.447    Lipid Panel No results found for: CHOL, TRIG, HDL, CHOLHDL, VLDL, LDLCALC, LDLDIRECT    Wt Readings from Last 3 Encounters:  11/16/17 173 lb 12.8 oz (78.8 kg)  10/13/17 174 lb (78.9 kg)  09/30/17 174 lb (78.9 kg)      Other studies Reviewed: Additional studies/ records that were reviewed today include: None. Review of the above records  demonstrates:     ASSESSMENT AND PLAN:  BRADYCARDIA:      He is having no symptoms with this.  No change in therapy.   ELEVATED CORONARY CALCIUM:   He had a negative perfusion study.  We talked about risk reduction.    DYSLIPIDEMIA:  His MESA score was 11. I will follow up a lipid profile in 8 weeks.   Current medicines are reviewed at length with the patient today.  The patient does not have concerns regarding medicines.  The following changes have been made:  None  Labs/ tests ordered today include:    Orders Placed This Encounter  Procedures  . Lipid panel     Disposition:   FU with me in 18 months     Signed, Rollene Rotunda, MD  11/16/2017 5:37 PM    [facility]

## 2017-11-16 ENCOUNTER — Encounter: Payer: Self-pay | Admitting: Cardiology

## 2017-11-16 ENCOUNTER — Ambulatory Visit (INDEPENDENT_AMBULATORY_CARE_PROVIDER_SITE_OTHER): Payer: Commercial Managed Care - PPO | Admitting: Cardiology

## 2017-11-16 VITALS — BP 123/77 | HR 56 | Ht 69.0 in | Wt 173.8 lb

## 2017-11-16 DIAGNOSIS — E785 Hyperlipidemia, unspecified: Secondary | ICD-10-CM

## 2017-11-16 DIAGNOSIS — Z5181 Encounter for therapeutic drug level monitoring: Secondary | ICD-10-CM | POA: Diagnosis not present

## 2017-11-16 DIAGNOSIS — R931 Abnormal findings on diagnostic imaging of heart and coronary circulation: Secondary | ICD-10-CM | POA: Diagnosis not present

## 2017-11-16 NOTE — Patient Instructions (Signed)
Medication Instructions:  Your physician recommends that you continue on your current medications as directed. Please refer to the Current Medication list given to you today.  Labwork: FASTING LIPID IN 8 WEEKS   Testing/Procedures: NONE  Follow-Up: Your physician wants you to follow-up in: 6318 MONTHS  You will receive a reminder letter in the mail two months in advance. If you don't receive a letter, please call our office to schedule the follow-up appointment.  If you need a refill on your cardiac medications before your next appointment, please call your pharmacy.

## 2018-01-31 ENCOUNTER — Ambulatory Visit
Payer: Commercial Managed Care - PPO | Attending: Internal Medicine | Admitting: Rehabilitative and Restorative Service Providers"

## 2018-01-31 DIAGNOSIS — R2689 Other abnormalities of gait and mobility: Secondary | ICD-10-CM | POA: Diagnosis present

## 2018-01-31 DIAGNOSIS — R42 Dizziness and giddiness: Secondary | ICD-10-CM | POA: Insufficient documentation

## 2018-01-31 NOTE — Patient Instructions (Signed)
Gaze Stabilization - Tip Card  1.Target must remain in focus, not blurry, and appear stationary while head is in motion. 2.Perform exercises with small head movements (45 to either side of midline). 3.Increase speed of head motion so long as target is in focus. 4.If you wear eyeglasses, be sure you can see target through lens (therapist will give specific instructions for bifocal / progressive lenses). 5.These exercises may provoke dizziness or nausea. Work through these symptoms. If too dizzy, slow head movement slightly. Rest between each exercise. 6.Exercises demand concentration; avoid distractions. 7.For safety, perform standing exercises close to a counter, wall, corner, or next to someone.  Copyright  VHI. All rights reserved.   Gaze Stabilization - Standing Feet Apart   Feet shoulder width apart, keeping eyes on target on wall 3 feet away, tilt head down slightly and move head side to side for 30 seconds. Repeat while moving head up and down for 30 seconds. *Work up to tolerating 60 seconds, as able. Do 3 sessions per day.  REPEAT WITH GLASSES ON X 30 SECONDS WORKING UP TO 60 SECONDS.  Copyright  VHI. All rights reserved.   Feet Together (Compliant Surface) Varied Arm Positions - Eyes Closed    Stand on compliant surface: ___pillow _____ with feet together and arms at your side. Close eyes and visualize upright position. Hold__30__ seconds. Repeat _3___ times per session. Do __2__ sessions per day.  Copyright  VHI. All rights reserved.   Head Motion: Side to Side    Sitting, tilt head down 15-30, slowly move head to right with eyes CLOSED. Hold position until symptoms subside. Then, move head slowly to opposite side. Hold position until symptoms subside. Repeat __5__ times per session. Do __2-3__ sessions per day.  Copyright  VHI. All rights reserved.   Diagonals    Sitting, bring head down with nose turning to the right. Return to upright.   Repeat ___5_  times per session. Do _2___ sessions per day.  REPEAT WITH GLASSES ON Copyright  VHI. All rights reserved.

## 2018-02-01 NOTE — Therapy (Signed)
[facility] [address] [place], [place], [address] Phone: [phone]   Fax:  [phone]  Physical Therapy Evaluation  Patient Details  Name: Jake Patterson MRN: [id number] Date of Birth: 1960-06-23 Referring Provider (PT): Martha Clan, MD   Encounter Date: 01/31/2018  PT End of Session - 02/01/18 2117    Visit Number  1    Number of Visits  6    Date for PT Re-Evaluation  04/02/18    Authorization Type  [employer]:  VL 60 combined with PT/OT/ST    PT Start Time  1410    PT Stop Time  1510    PT Time Calculation (min)  60 min    Activity Tolerance  Patient tolerated treatment well    Behavior During Therapy  [facility] for tasks assessed/performed       Past Medical History:  Diagnosis Date  . BPH (benign prostatic hyperplasia)   . Cardiac arrest (HCC) 09/25/2017  . Family history of premature CAD   . Former light tobacco smoker    Less than 1/4 pack/day x 20 years  . Hard of hearing    Left ear  . Hyperlipidemia with target LDL less than 100   . Impaired fasting glucose     Past Surgical History:  Procedure Laterality Date  . TYMPANOMASTOIDECTOMY Left 07/19/2017   Procedure: LEFT CANAL WALL TYMPANOMASTOIDECTOMY;  Surgeon: Newman Pies, MD;  [facility];  Service: ENT;  Laterality: Left;  . WISDOM TOOTH EXTRACTION      There were no vitals filed for this visit.   Subjective Assessment - 02/01/18 0800    Subjective  The patient reports h/o ear surgery 07/19/2017.  He began with severe dizziness the next day having to crawl on the floor, then could walk hanging onto the walls and after 2 weeks noted improvement and ability to return to work.  He was doing well and then had cardiac arrest after a heart procedure with some worsening of dizziness again.   He notices dizziness with quick head motion, bending to get water out of a water fountain.  He is using night lights at night.  He is very active  and has returned to cycling, and running.  He is avoiding soccer due to fall risk.     Pertinent History  POSTOPERATIVE DIAGNOSIS: Left ear cholesteatoma, L hearing loss, L chronic mastoiditis.  Cardiac arrest.    Patient Stated Goals  Try to bring on symptoms and learn to fix it.    Currently in Pain?  No/denies         [facility] PT Assessment - 01/31/18 2119      Assessment   Medical Diagnosis  vertigo, L hypofunction    Referring Provider (PT)  Martha Clan, MD    Onset Date/Surgical Date  07/19/17      Precautions   Precaution Comments  L side hearing loss      Restrictions   Weight Bearing Restrictions  No      Balance Screen   Has the patient fallen in the past 6 months  No    Has the patient had a decrease in activity level because of a fear of falling?   No    Is the patient reluctant to leave their home because of a fear of falling?   No      Home Public house manager residence    Living Arrangements  Spouse/significant other  Prior Function   Level of Independence  Independent    Vocation  Full time employment    Vocation Requirements  sitting throughout the day.           Vestibular Assessment - 01/31/18 2120      Vestibular Assessment   General Observation  Patient ambulates into clinic without a device at fast pace.      Symptom Behavior   Type of Dizziness  Imbalance   "haven't had the spin for a long time"   Frequency of Dizziness  daily    Duration of Dizziness  seconds    Aggravating Factors  Turning head sideways;Turning head quickly   water foundtain   Relieving Factors  Head stationary      Occulomotor Exam   Occulomotor Alignment  Normal    Spontaneous  Absent    Gaze-induced  Absent    Smooth Pursuits  Intact    Saccades  Intact      Vestibulo-Occular Reflex   VOR 1 Head Only (x 1 viewing)  at fast pace x 10 reps brings on mild symptoms of 2-3/10     VOR 2 Head and Object (x 2 viewing)  difficulty to  coordinate x 2 viewing    Comment  Head impulse test=low amplitude refixation saccade to the left.      Visual Acuity   Static  line 7    Dynamic  line 3   provokes a mild sensaiton of dizziness     Positional Testing   Dix-Hallpike  Dix-Hallpike Right;Dix-Hallpike Left    Horizontal Canal Testing  Horizontal Canal Right;Horizontal Canal Left      Dix-Hallpike Right   Dix-Hallpike Right Duration  0    Dix-Hallpike Right Symptoms  No nystagmus      Dix-Hallpike Left   Dix-Hallpike Left Duration  0    Dix-Hallpike Left Symptoms  No nystagmus      Horizontal Canal Right   Horizontal Canal Right Duration  0    Horizontal Canal Right Symptoms  Normal      Horizontal Canal Left   Horizontal Canal Left Duration  0    Horizontal Canal Left Symptoms  Normal      Cytogeneticist Comment  Sensory organization testing=70%, which is normal based on age/height normative values.  He demonstrated overall WNLs use of somatosensory, visual and vestibular inputs for balance.  Patient had some trials of below normal performance during sway surround (condition 3), and 1 trial of multisensory conditions 4,5,6.      Positional Sensitivities   Right Hallpike  Lightheadedness    Up from Right Hallpike  No dizziness    Up from Left Hallpike  No dizziness    Head Turning x 5  Mild dizziness    Head Nodding x 5  No dizziness    Rolling Right  Lightheadedness    Rolling Left  Mild dizziness          Objective measurements completed on examination: See above findings.              PT Education - 02/01/18 2116    Education Details  Gaze adaptation x 1 viewing standing x 1 minute, feet together + eyes closed on compliant surface, habituation for diagonal head motion and horizontal head motion    Person(s) Educated  Patient    Methods  Explanation;Demonstration;Handout    Comprehension  Verbalized understanding;Returned  demonstration  PT Short Term Goals - 02/01/18 2130      PT SHORT TERM GOAL #1   Title  The patient will be indep with HEP for habituation, balance and gaze adaptation exercises.    Time  4    Period  Weeks    Target Date  03/03/18      PT SHORT TERM GOAL #2   Title  The patient will improve gaze x 1 viewing to tolerate 2 minutes nonstop with dizziness < or equal to 2/10.    Baseline  patient has 2-3/10 symptoms with 10 reps.    Time  4    Period  Weeks    Target Date  03/03/18      PT SHORT TERM GOAL #3   Title  The patient will return to jogging activities on grass/compliant surfaces.    Time  4    Period  Weeks    Target Date  03/03/18      PT SHORT TERM GOAL #4   Title  Complete FGA and write LTG if deficits present.    Time  4    Period  Weeks    Target Date  03/03/18        PT Long Term Goals - 02/01/18 2133      PT LONG TERM GOAL #1   Title  The patient will be indep with HEP for post d/c progression.    Time  8    Period  Weeks    Target Date  04/02/18      PT LONG TERM GOAL #2   Title  The patient will tolerate bending with head rotation to the right with no dizziness.    Baseline  Notes 3/10 with forward bend + head rotation.  He functionally notes difficulty getting water out of water fountain.    Time  8    Period  Weeks    Target Date  04/02/18      PT LONG TERM GOAL #3   Title  The patient will improve SVA vs DVA to a 3 or less line difference.    Baseline  4 line difference, >2 is abnormal    Time  8    Period  Weeks    Target Date  04/02/18             Plan - 02/01/18 2135    Clinical Impression Statement  The patient is a 57 year old male presenting to outpatient physical therapy s/p L ear procedure for cholesteatoma, and chronic mastoiditis.    He notes significant dizziness x 2 weeks s/p procedure.  He has improved with overall mobility and dizziness, but still notes difficulty with quick head movements, bending forward and  imbalance with walking in low light environments.  On clinical exam, patient with positive head impulse test and abnormal static versus dynamic visual acuity indicaitng diminsihed function of the VOR consistent with L hypofunction.  He also has motion intolerance, decreased high level balance.  PT to address deficits to promote improved vestibular adaptation and compensation.      History and Personal Factors relevant to plan of care:  Left ear cholesteatoma, L hearing loss, L chronic mastoiditis, cardiac arrest.    Clinical Presentation  Stable    Clinical Decision Making  Low    Rehab Potential  Good    Clinical Impairments Affecting Rehab Potential  patient's activity level and motivation     PT Frequency  1x / week    PT  Duration  6 weeks   over 8 week period if needed/ patient anticipates every other week availability due to work schedule   PT Treatment/Interventions  ADLs/Self Care Home Management;Balance training;Neuromuscular re-education;Vestibular;Manual techniques;Therapeutic activities;Therapeutic exercise;Stair training;Gait training;Patient/family education    PT Next Visit Plan  Check HEP, complete FGA, compliant surface standing, eye/hand coordination activities.    Consulted and Agree with Plan of Care  Patient       Patient will benefit from skilled therapeutic intervention in order to improve the following deficits and impairments:  Dizziness, Decreased activity tolerance, Decreased balance  Visit Diagnosis: Dizziness and giddiness  Other abnormalities of gait and mobility     Problem List Patient Active Problem List   Diagnosis Date Noted  . Dyslipidemia 09/30/2017  . Dizziness 09/30/2017  . Cardiac arrest (HCC) 09/25/2017  . Asystole (HCC) 09/24/2017  . Bradycardia   . 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 Pure hypercholesterolemia   . Non-intractable vomiting with nausea   . Coronary artery calcification seen on CAT scan -> Elevated  coronary calcium score 08/11/2017  . Family history of premature CAD 08/11/2017  . Hyperlipidemia with target LDL less than 100 08/11/2017    Cecily Lawhorne, PT 02/01/2018, 9:49 PM  [facility] [address] [place], [place], [address] Phone: [phone]   Fax:  [phone]  Name: Jake Patterson MRN: [id number] Date of Birth: 1960-09-11

## 2018-02-18 ENCOUNTER — Ambulatory Visit
Payer: Commercial Managed Care - PPO | Attending: Internal Medicine | Admitting: Rehabilitative and Restorative Service Providers"

## 2018-02-18 DIAGNOSIS — R2689 Other abnormalities of gait and mobility: Secondary | ICD-10-CM | POA: Diagnosis present

## 2018-02-18 DIAGNOSIS — R42 Dizziness and giddiness: Secondary | ICD-10-CM | POA: Diagnosis not present

## 2018-02-18 NOTE — Therapy (Signed)
[facility] [address] [place], [place], [address] Phone: [phone]   Fax:  [phone]  Physical Therapy Treatment  Patient Details  Name: Jake Patterson MRN: [id number] Date of Birth: 30-Dec-1960 Referring Provider (PT): Marton Redwood, MD   Encounter Date: 02/18/2018  PT End of Session - 02/18/18 1116    Visit Number  2    Number of Visits  6    Date for PT Re-Evaluation  04/02/18    Authorization Type  honda UMR:  VL 60 combined with PT/OT/ST    PT Start Time  1025    PT Stop Time  1055    PT Time Calculation (min)  30 min    Activity Tolerance  Patient tolerated treatment well    Behavior During Therapy  [facility] for tasks assessed/performed       Past Medical History:  Diagnosis Date  . BPH (benign prostatic hyperplasia)   . Cardiac arrest ([place]) 09/25/2017  . Family history of premature CAD   . Former light tobacco smoker    Less than 1/4 pack/day x 20 years  . Hard of hearing    Left ear  . Hyperlipidemia with target LDL less than 100   . Impaired fasting glucose     Past Surgical History:  Procedure Laterality Date  . TYMPANOMASTOIDECTOMY Left 07/19/2017   Procedure: LEFT CANAL WALL TYMPANOMASTOIDECTOMY;  Surgeon: Leta Baptist, MD;  Location: [place];  Service: ENT;  Laterality: Left;  . WISDOM TOOTH EXTRACTION      There were no vitals filed for this visit.  Subjective Assessment - 02/18/18 1109    Subjective  The patient reports symptoms are significantly improved.    Pertinent History  POSTOPERATIVE DIAGNOSIS: Left ear cholesteatoma, L hearing loss, L chronic mastoiditis.  Cardiac arrest.    Patient Stated Goals  Try to bring on symptoms and learn to fix it.    Currently in Pain?  No/denies         [facility] PT Assessment - 02/18/18 1047      Functional Gait  Assessment   Gait assessed   Yes    Gait Level Surface  Walks 20 ft in less than 5.5 sec, no assistive devices, good speed,  no evidence for imbalance, normal gait pattern, deviates no more than 6 in outside of the 12 in walkway width.    Change in Gait Speed  Able to smoothly change walking speed without loss of balance or gait deviation. Deviate no more than 6 in outside of the 12 in walkway width.    Gait with Horizontal Head Turns  Performs head turns smoothly with slight change in gait velocity (eg, minor disruption to smooth gait path), deviates 6-10 in outside 12 in walkway width, or uses an assistive device.    Gait with Vertical Head Turns  Performs head turns with no change in gait. Deviates no more than 6 in outside 12 in walkway width.    Gait and Pivot Turn  Pivot turns safely within 3 sec and stops quickly with no loss of balance.    Step Over Obstacle  Is able to step over 2 stacked shoe boxes taped together (9 in total height) without changing gait speed. No evidence of imbalance.    Gait with Narrow Base of Support  Ambulates 7-9 steps.    Gait with Eyes Closed  Walks 20 ft, uses assistive device, slower speed, mild gait deviations, deviates 6-10 in outside 12 in  walkway width. Ambulates 20 ft in less than 9 sec but greater than 7 sec.    Ambulating Backwards  Walks 20 ft, no assistive devices, good speed, no evidence for imbalance, normal gait    Steps  Alternating feet, no rail.    Total Score  27    FGA comment:  27/30                   [facility] Adult PT Treatment/Exercise - 02/18/18 1125      Neuro Re-ed    Neuro Re-ed Details   Eye/hand coordination activities not challenging today with ball toss.  Performed tandem gait and added to HEP.  Patient has mild veering with horizontal head motion during gait.  PT added for HEP.  Performed gaze adaptation x 2 viewing in seated position.      Vestibular Treatment/Exercise - 02/18/18 1117      Vestibular Treatment/Exercise   Vestibular Treatment Provided  Habituation;Gaze    Habituation Exercises  360 degree Turns    Gaze Exercises  X1  Viewing Horizontal;X1 Viewing Vertical      360 degree Turns   Number of Reps   3    Symptom Description   increases dizziness to moderate level and settles within seconds    COMMENT  recommended for HEP with visual fixation      X1 Viewing Horizontal   Foot Position  feet apart> progressed to feet together>progressed to tandem standing    Comments  30 seconds with feet tandem, 60 seconds when feet apart and together      X1 Viewing Vertical   Foot Position  feet apart>feet together>feet tandem    Comments  easier vertical than horizontal- recomended patient focus on horizontal motion            PT Education - 02/18/18 1116    Education Details  updated HEP to include dynamic gait, more narrow base of support wiht gaze and habituation    Person(s) Educated  Patient    Methods  Explanation;Demonstration;Handout    Comprehension  Verbalized understanding;Returned demonstration       PT Short Term Goals - 02/18/18 1116      PT SHORT TERM GOAL #1   Title  The patient will be indep with HEP for habituation, balance and gaze adaptation exercises.    Time  4    Period  Weeks    Status  Achieved      PT SHORT TERM GOAL #2   Title  The patient will improve gaze x 1 viewing to tolerate 2 minutes nonstop with dizziness < or equal to 2/10.    Baseline  patient has 2-3/10 symptoms with 10 reps.    Time  4    Period  Weeks      PT SHORT TERM GOAL #3   Title  The patient will return to jogging activities on grass/compliant surfaces.    Baseline  per subjective report    Time  4    Period  Weeks    Status  Achieved      PT SHORT TERM GOAL #4   Title  Complete FGA and write LTG if deficits present.    Baseline  Scored 27/30    Time  4    Period  Weeks    Status  Achieved        PT Long Term Goals - 02/01/18 2133      PT LONG TERM GOAL #1   Title  The patient will be indep with HEP for post d/c progression.    Time  8    Period  Weeks    Target Date  04/02/18       PT LONG TERM GOAL #2   Title  The patient will tolerate bending with head rotation to the right with no dizziness.    Baseline  Notes 3/10 with forward bend + head rotation.  He functionally notes difficulty getting water out of water fountain.    Time  8    Period  Weeks    Target Date  04/02/18      PT LONG TERM GOAL #3   Title  The patient will improve SVA vs DVA to a 3 or less line difference.    Baseline  4 line difference, >2 is abnormal    Time  8    Period  Weeks    Target Date  04/02/18            Plan - 02/18/18 1128    Clinical Impression Statement  The patient met 3 STGs.  PT progressed HEP and will f/u in 3-4 weeks to determine progress.     PT Treatment/Interventions  ADLs/Self Care Home Management;Balance training;Neuromuscular re-education;Vestibular;Manual techniques;Therapeutic activities;Therapeutic exercise;Stair training;Gait training;Patient/family education    PT Next Visit Plan  check HEP and progress if needed, discharge when able.    Consulted and Agree with Plan of Care  Patient       Patient will benefit from skilled therapeutic intervention in order to improve the following deficits and impairments:  Dizziness, Decreased activity tolerance, Decreased balance  Visit Diagnosis: Dizziness and giddiness  Other abnormalities of gait and mobility     Problem List Patient Active Problem List   Diagnosis Date Noted  . Dyslipidemia 09/30/2017  . Dizziness 09/30/2017  . Cardiac arrest ([place]) 09/25/2017  . Asystole ([place]) 09/24/2017  . Bradycardia   . 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 Pure hypercholesterolemia   . Non-intractable vomiting with nausea   . 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 Family history of premature CAD 08/11/2017  . Hyperlipidemia with target LDL less than 100 08/11/2017    Francee Setzer, PT 02/18/2018, 11:29 AM  [facility] [address] [place], [place], [address] Phone: [phone]   Fax:  [phone]  Name: Jake Patterson MRN: [id number] Date of Birth: 06/03/1960

## 2018-02-18 NOTE — Patient Instructions (Addendum)
Gaze Stabilization - Tip Card  1.Target must remain in focus, not blurry, and appear stationary while head is in motion. 2.Perform exercises with small head movements (45 to either side of midline). 3.Increase speed of head motion so long as target is in focus. 4.If you wear eyeglasses, be sure you can see target through lens (therapist will give specific instructions for bifocal / progressive lenses). 5.These exercises may provoke dizziness or nausea. Work through these symptoms. If too dizzy, slow head movement slightly. Rest between each exercise. 6.Exercises demand concentration; avoid distractions. 7.For safety, perform standing exercises close to a counter, wall, corner, or next to someone.  Copyright  VHI. All rights reserved.   Gaze Stabilization: Standing Feet Together    Feet together, keeping eyes on target on wall ___6_ feet away, tilt head down 15-30 and move head side to side for __60 seconds.  Repeat while moving head up and down for _60 seconds. Do __2-3__ sessions per day. Repeat using target on pattern background.  REPEAT WITH GLASSES ON.  Copyright  VHI. All rights reserved.   Visuo-Vestibular: Head / Eyes Moving in Opposite Direction    Holding a target, keep eyes on target and slowly move target side to side while moving head in OPPOSITE direction of target for _30___ seconds. Perform sitting. Repeat __2__ times per session. Do __2__ sessions per day. Copyright  VHI. All rights reserved.      Gaze Stabilization: Standing Feet Heel-Toe "Tandem"    Feet in full heel-toe position, keeping eyes on target on wall _6___ feet away, tilt head down 15-30 and move head side to side for __30__ seconds. Repeat while moving head up and down for __30__ seconds. Do _2-3___ sessions per day.   Copyright  VHI. All rights reserved.    Feet Together (Compliant Surface) Varied Arm Positions - Eyes Closed    Stand on compliant surface: ___pillow _____ with  feet together and arms at your side. Close eyes and visualize upright position. Hold__30__ seconds. Repeat _3___ times per session. Do __2__ sessions per day.  Copyright  VHI. All rights reserved.    Diagonals    Sitting, bring head down with nose turning to the right. Return to upright.   Repeat ___5_ times per session. Do _2___ sessions per day.  REPEAT WITH GLASSES ON Copyright  VHI. All rights reserved.   Turning in Place: Solid Surface    Standing in place, lead with head and turn slowly making full turns toward left. Use a target to visually fixate after each turn. Repeat _3-5___ times, then repeat 3 -5 times to the opposite side.  Do _2___ sessions per day.   Copyright  VHI. All rights reserved.   Feet Heel-Toe "Tandem"    Arms outstretched, walk a straight line bringing one foot directly in front of the other. Repeat for __30 feet x 5 reps.  Copyright  VHI. All rights reserved.  Side to Side Head Motion    Perform without assistive device. Walking on solid surface, turn head and eyes to left for __4__ steps. Then, turn head and eyes to opposite side for _4_ steps. Repeat sequence _10___ times per session. Do _1-2___ sessions per day.  Copyright  VHI. All rights reserved.

## 2018-03-04 ENCOUNTER — Ambulatory Visit: Payer: Commercial Managed Care - PPO | Admitting: Rehabilitative and Restorative Service Providers"

## 2018-03-04 ENCOUNTER — Encounter: Payer: Self-pay | Admitting: Rehabilitative and Restorative Service Providers"

## 2018-03-04 DIAGNOSIS — R2689 Other abnormalities of gait and mobility: Secondary | ICD-10-CM

## 2018-03-04 DIAGNOSIS — R42 Dizziness and giddiness: Secondary | ICD-10-CM

## 2018-03-04 NOTE — Patient Instructions (Signed)
  3 days/week x 4 more weeks than continue to reduce frequency  Gaze Stabilization - Tip Card  1.Target must remain in focus, not blurry, and appear stationary while head is in motion. 2.Perform exercises with small head movements (45 to either side of midline). 3.Increase speed of head motion so long as target is in focus. 4.If you wear eyeglasses, be sure you can see target through lens (therapist will give specific instructions for bifocal / progressive lenses). 5.These exercises may provoke dizziness or nausea. Work through these symptoms. If too dizzy, slow head movement slightly. Rest between each exercise. 6.Exercises demand concentration; avoid distractions. 7.For safety, perform standing exercises close to a counter, wall, corner, or next to someone.  Copyright  VHI. All rights reserved.  Visuo-Vestibular: Head / Eyes Moving in Opposite Direction    Holding a target, keep eyes on target and slowly move target side to side while moving head in OPPOSITE direction of target for _30___ seconds. Perform sitting. Repeat __2__ times per session. Do __1__ sessions per day. Copyright  VHI. All rights reserved.      Gaze Stabilization: Standing Feet Heel-Toe "Tandem"    Feet in full heel-toe position, keeping eyes on target on wall _6___ feet away, tilt head down 15-30 and move head side to side for __30__ seconds. Repeat while moving head up and down for __30__ seconds. Do _2___ sessions per day.   Copyright  VHI. All rights reserved.      Turning in Place: Solid Surface    Standing in place, lead with head and turn slowly making full turns toward left. Use a target to visually fixate after each turn. Repeat _3-5___ times, then repeat 3 -5 times to the opposite side.  Do _1___ sessions per day.   Copyright  VHI. All rights reserved.   Feet Heel-Toe "Tandem", Varied Arm Positions - Eyes Closed    Stand with right foot directly in front of the other  and arms out. Close eyes and visualize upright position. Hold__30__ seconds. Repeat _2___ times per session. Do _1___ sessions per day.  Copyright  VHI. All rights reserved.

## 2018-03-04 NOTE — Therapy (Signed)
[facility] [address] [place], [place], [address] Phone: [phone]   Fax:  [phone]  Physical Therapy Treatment and Discharge Summary  Patient Details  Name: Jake Patterson MRN: [id number] Date of Birth: 1960-09-30 Referring Provider (PT): Marton Redwood, MD   Encounter Date: 03/04/2018  PT End of Session - 03/04/18 1504    Visit Number  3    Number of Visits  6    Date for PT Re-Evaluation  04/02/18    Authorization Type  [employer] UMR:  VL 60 combined with PT/OT/ST    PT Start Time  1504    PT Stop Time  1530    PT Time Calculation (min)  26 min    Activity Tolerance  Patient tolerated treatment well    Behavior During Therapy  [facility] for tasks assessed/performed       Past Medical History:  Diagnosis Date  . BPH (benign prostatic hyperplasia)   . Cardiac arrest ([place]) 09/25/2017  . Family history of premature CAD   . Former light tobacco smoker    Less than 1/4 pack/day x 20 years  . Hard of hearing    Left ear  . Hyperlipidemia with target LDL less than 100   . Impaired fasting glucose     Past Surgical History:  Procedure Laterality Date  . TYMPANOMASTOIDECTOMY Left 07/19/2017   Procedure: LEFT CANAL WALL TYMPANOMASTOIDECTOMY;  Surgeon: Leta Baptist, MD;  Location: [facility];  Service: ENT;  Laterality: Left;  . WISDOM TOOTH EXTRACTION      There were no vitals filed for this visit.  Subjective Assessment - 03/04/18 1503    Subjective  The patient reports he hasn't been as good about HEP as he was after the first visit due to traveling.  He only notes dizziness when performing 360 degree turns.     Pertinent History  POSTOPERATIVE DIAGNOSIS: Left ear cholesteatoma, L hearing loss, L chronic mastoiditis.  Cardiac arrest.    Patient Stated Goals  Try to bring on symptoms and learn to fix it.    Currently in Pain?  No/denies             Vestibular Assessment - 03/04/18 1510       Vestibular Assessment   General Observation  Patient reports only 360 deegree turns provoke symptoms.       Visual Acuity   Static  line 6    Dynamic  line 4               [facility] Adult PT Treatment/Exercise - 03/04/18 1510      Neuro Re-ed    Neuro Re-ed Details   Reviewed HEP modifying program to continue gaze x 1 viewing with tandem stance, x 2 viewing seated, 360 degree turns, tandem stance with eyes closed. D/C gaze with feet together, walking with head turns, foam with eyes closed.  Discussed progression of HEP dec'ing frequency to ensure he maintains vestibular adaptation/compensation.  Discussed self mgmt for times of potential decompensation (after bed rest or illness) and using HEP to improve vestibular function.      Vestibular Treatment/Exercise - 03/04/18 1657      Vestibular Treatment/Exercise   Vestibular Treatment Provided  Gaze      X1 Viewing Horizontal   Foot Position  feet together x 2 minutes without dizziness            PT Education - 03/04/18 1530    Education Details  Updated HEP  to continue habituation and gaze adaptation     Person(s) Educated  Patient       PT Short Term Goals - 03/04/18 1504      PT SHORT TERM GOAL #1   Title  The patient will be indep with HEP for habituation, balance and gaze adaptation exercises.    Time  4    Period  Weeks    Status  Achieved      PT SHORT TERM GOAL #2   Title  The patient will improve gaze x 1 viewing to tolerate 2 minutes nonstop with dizziness < or equal to 2/10.    Baseline  no dizziness with gaze x 1 x 2 minutes.    Time  4    Period  Weeks    Status  Achieved      PT SHORT TERM GOAL #3   Title  The patient will return to jogging activities on grass/compliant surfaces.    Baseline  per subjective report    Time  4    Period  Weeks    Status  Achieved      PT SHORT TERM GOAL #4   Title  Complete FGA and write LTG if deficits present.    Baseline  Scored 27/30    Time  4    Period   Weeks    Status  Achieved        PT Long Term Goals - 03/04/18 1511      PT LONG TERM GOAL #1   Title  The patient will be indep with HEP for post d/c progression.    Time  8    Period  Weeks    Status  Achieved      PT LONG TERM GOAL #2   Title  The patient will tolerate bending with head rotation to the right with no dizziness.    Baseline  Notes 3/10 with forward bend + head rotation.  He functionally notes difficulty getting water out of water fountain.    Time  8    Period  Weeks    Status  Achieved      PT LONG TERM GOAL #3   Title  The patient will improve SVA vs DVA to a 3 or less line difference.    Baseline  4 line difference, >2 is abnormal    Time  8    Period  Weeks    Status  Achieved            Plan - 03/04/18 1657    Clinical Impression Statement  The patient met all STGs/LTGs with no dizziness noted during daily tasks.  The patient can only provoke symptoms with 360 degree turns x 3 reps.  PT recommended continued HEP (modified today to only continue challenging activities) x 4 weeks at lower frequency and then stop doing as long as he maintains current status of no dizziness t/o daily activities.     PT Treatment/Interventions  ADLs/Self Care Home Management;Balance training;Neuromuscular re-education;Vestibular;Manual techniques;Therapeutic activities;Therapeutic exercise;Stair training;Gait training;Patient/family education    PT Next Visit Plan  Discharge today.    Consulted and Agree with Plan of Care  Patient       Patient will benefit from skilled therapeutic intervention in order to improve the following deficits and impairments:  Dizziness, Decreased activity tolerance, Decreased balance  Visit Diagnosis: Dizziness and giddiness  Other abnormalities of gait and mobility  PHYSICAL THERAPY DISCHARGE SUMMARY  Visits from Start of Care: 3  Current functional level related to goals / functional outcomes: See above   Remaining  deficits: None   Education / Equipment: HEP, self management of symptoms.  Plan: Patient agrees to discharge.  Patient goals were met. Patient is being discharged due to meeting the stated rehab goals.  ?????        Thank you for the referral of this patient. Rudell Cobb, MPT     [place] , PT 03/04/2018, 4:58 PM  [facility] [address] [place], [place], [address] Phone: [phone]   Fax:  [phone]  Name: JULION GATT MRN: [id number] Date of Birth: 10/12/1960

## 2018-03-05 NOTE — Progress Notes (Signed)
Formatting of this note is different from the original.    Subjective     Patient ID:  Stanley Mullins is a 57 y.o. (DOB [birth year]) male    Patient presents with   ? Sinusitis     sinus pain/pressure, teeth pain X 4 days       HPI: Stanley Mullins is being seen today for sinus pressure/ teeth pain x 4 days. Stanley Mullins reports having season allergies and uses a Environmental consultant with some relief. Stanley Mullins also reports using OTC ibuprofen that helped relieve his teeth pain x 1day and then the pain returned. Stanley Mullins denies sick contacts, fever, chills, night sweats or fatigue.     Past Medical History, Past Surgery History, Allergies, Social History, and Family History were reviewed and updated.      Review of Systems is complete and negative except as noted.    Objective   BP 127/87 (BP Location: Left arm, Patient Position: Sitting)   Pulse 79   Temp 99 F (37.2 C) (Oral)   Resp 18   Ht 5\' 9"  (1.753 m)   Wt 175 lb 2 oz (79.4 kg)   SpO2 99%   BMI 25.86 kg/m      General:  Well developed, Well nourished, No distress  HEENT:  Conjunctiva normal; Bilateral external auditory canals without discharge. Left TM surgically removed. Right TM negative for air/fluid, non-bulging. membrane normal; Nares normal; Oropharynx moist and clear. Maxillary sinuses TTP.   Neck:  Supple with normal range of motion; Large firm, mobile cervical lymphadenopathy, non-tender.   CV:  S1S2, Regular rate and rhythm, without murmur, gallops or rubs  Lungs:  Clear to auscultation bilaterally with normal effort  Abdomen:  Bowel sounds active, soft, non-tender, non-distended, No hepatosplenomegaly or masses   Skin:  No focal rashes, warm to touch.   Extremities:  No clubbing, cyanosis or edema.  Dorsalis pedis/posterior tibial pulses 2+  Neuro:  No focal deficits; Cranial nerves II-XII intact       Assessment and Plan   1. Acute maxillary sinusitis, recurrence not specified (Primary)  -     amoxicillin-clavulanate (AUGMENTIN) 875-125 mg per tablet; Take one tablet by mouth 2  (two) times daily for 10 days. Take with food, Starting Sat 03/05/2018, Until Tue 03/15/2018, Normal  2. Sinus pressure  -     predniSONE (DELTASONE) 10 mg tablet; Take one tablet (10 mg dose) by mouth daily for 6 days. Take 60mg day1 50mg day2 40mg day3 30mg day4 20mg day5 10mg day6, Starting Sat 03/05/2018, Until Fri 03/11/2018, Normal    Take meds as prescribed.    Follow up if symptoms worsen or fail to improve, for with PCP.         Risks, benefits, and alternatives of the medications and treatment plan prescribed today were discussed, and patient expressed understanding. Plan follow-up as discussed or as needed if any worsening symptoms or change in condition.      A yearly preventative health exam was recommended and current age based recommendations were discussed.    I have reviewed the information contained in this note and personally verified its accuracy.  I obtained or reviewed the history of present illness and personally performed the physical exam.  Filomena Jungling, NP      Electronically signed by Filomena Jungling, NP at 03/05/2018  1:03 PM EST

## 2018-06-07 DIAGNOSIS — H95122 Granulation of postmastoidectomy cavity, left ear: Secondary | ICD-10-CM | POA: Diagnosis not present

## 2018-06-09 DIAGNOSIS — Z125 Encounter for screening for malignant neoplasm of prostate: Secondary | ICD-10-CM | POA: Diagnosis not present

## 2018-06-09 DIAGNOSIS — R82998 Other abnormal findings in urine: Secondary | ICD-10-CM | POA: Diagnosis not present

## 2018-06-09 DIAGNOSIS — R7301 Impaired fasting glucose: Secondary | ICD-10-CM | POA: Diagnosis not present

## 2018-06-09 DIAGNOSIS — Z0000 Encounter for general adult medical examination without abnormal findings: Secondary | ICD-10-CM | POA: Diagnosis not present

## 2018-06-09 DIAGNOSIS — E7849 Other hyperlipidemia: Secondary | ICD-10-CM | POA: Diagnosis not present

## 2018-06-16 DIAGNOSIS — R7301 Impaired fasting glucose: Secondary | ICD-10-CM | POA: Diagnosis not present

## 2018-06-16 DIAGNOSIS — Z125 Encounter for screening for malignant neoplasm of prostate: Secondary | ICD-10-CM | POA: Diagnosis not present

## 2018-06-16 DIAGNOSIS — Z0000 Encounter for general adult medical examination without abnormal findings: Secondary | ICD-10-CM | POA: Diagnosis not present

## 2018-06-16 DIAGNOSIS — E7849 Other hyperlipidemia: Secondary | ICD-10-CM | POA: Diagnosis not present

## 2018-06-16 DIAGNOSIS — I2510 Atherosclerotic heart disease of native coronary artery without angina pectoris: Secondary | ICD-10-CM | POA: Diagnosis not present

## 2018-06-28 DIAGNOSIS — J157 Pneumonia due to Mycoplasma pneumoniae: Secondary | ICD-10-CM | POA: Diagnosis not present

## 2019-01-04 NOTE — Progress Notes (Signed)
Virtual Visit via Telephone Note   This visit type was conducted due to national recommendations for restrictions regarding the COVID-19 Pandemic (e.g. social distancing) in an effort to limit this patient's exposure and mitigate transmission in our community.  Due to his co-morbid illnesses, this patient is at least at moderate risk for complications without adequate follow up.  This format is felt to be most appropriate for this patient at this time.  The patient did not have access to video technology/had technical difficulties with video requiring transitioning to audio format only (telephone).  All issues noted in this document were discussed and addressed.  No physical exam could be performed with this format.  Please refer to the patient's chart for his  consent to telehealth for [facility].   Date:  01/05/2019   ID:  Jake Patterson, DOB Aug 27, 1960, MRN [id number]  Patient Location: Home Provider Location: Home  PCP:  Martha Clan, MD  Cardiologist:  Rollene Rotunda, MD  Electrophysiologist:  None   Evaluation Performed:  Follow-Up Visit  Chief Complaint:  Palpitations  History of Present Illness:     Jake Patterson is a 58 y.o. male who presents for follow up of bradycardia.  He has coronary calcium and was to have a coronary CTA.  However, when he was given beta blockers he had bradycardia and asystolic.  He actually required chest compression.  He had a repeat episode with a blood draw.   He was treated with glucagon and dopamine.  Echo was unremarkable.  He returns for follow up.  He had a negative perfusion study.      Since I last saw him he has had increased palpitations.  This happened from about the 11th of this month to the 21st.  For about 5 to 10 minutes every morning he feels a skipping heartbeats.  He has had none in the last 2 days.  He has a home cholesterol monitor now.  He stuck his finger not long ago and had a syncopal episode with this.  He was reading  about this.  He saw that with his low heart rate, pacemaker might be an option and he wanted to talk about this.  He has not had any chest pressure, neck or arm discomfort.  He has not had any new shortness of breath, PND or orthopnea  The patient does not have symptoms concerning for COVID-19 infection (fever, chills, cough, or new shortness of breath).    Past Medical History:  Diagnosis Date  . BPH (benign prostatic hyperplasia)   . Cardiac arrest (HCC) 09/25/2017  . Family history of premature CAD   . Former light tobacco smoker    Less than 1/4 pack/day x 20 years  . Hard of hearing    Left ear  . Hyperlipidemia with target LDL less than 100   . Impaired fasting glucose    Past Surgical History:  Procedure Laterality Date  . TYMPANOMASTOIDECTOMY Left 07/19/2017   Procedure: LEFT CANAL WALL TYMPANOMASTOIDECTOMY;  Surgeon: Newman Pies, MD;  Location: [facility];  Service: ENT;  Laterality: Left;  . WISDOM TOOTH EXTRACTION       Prior to Admission medications   Medication Sig Start Date End Date Taking? Authorizing Provider  Multiple Vitamins-Minerals (CENTRUM SILVER PO) Take 1 tablet by mouth 3 (three) times a week.    Yes [provider]  Omega-3 Fatty Acids (FISH OIL PO) Take 2 capsules by mouth daily.    Yes  [provider]  Probiotic Product (PROBIOTIC DAILY PO) Take 1 capsule by mouth daily.    Yes [provider]  rosuvastatin (CRESTOR) 20 MG tablet Take 0.5 tablets (10 mg total) by mouth at bedtime. 09/25/17 01/05/19 Yes Leone Brand, NP  Saw Palmetto, Serenoa repens, (SAW PALMETTO PO) Take 1 capsule by mouth daily.    Yes [provider]  sildenafil (REVATIO) 20 MG tablet Take 20 mg by mouth daily as needed (for ED).    Yes [provider]     Allergies:   Metoprolol tartrate   Social History   Tobacco Use  . Smoking status: Former Smoker    Packs/day: 0.25    Years: 20.00    Pack years: 5.00    Types:  Cigarettes    Start date: 08/13/1980    Quit date: 08/13/2000    Years since quitting: 18.4  . Smokeless tobacco: Never Used  . Tobacco comment: Quit smoking 2003  Substance Use Topics  . Alcohol use: Yes    Comment: Occass  . Drug use: Never     Family Hx: The patient's family history includes Alzheimer's disease in his mother; Coronary artery disease (age of onset: 4) in his father; Coronary artery disease (age of onset: 43) in his paternal grandfather; Heart attack (age of onset: 74) in his father.  ROS:   Please see the history of present illness.    All other systems reviewed and are negative.   Prior CV studies:   The following studies were reviewed today:  Echo, Lexiscan Myoview.  Labs/Other Tests and Data Reviewed:    EKG:  No ECG reviewed.  Recent Labs: No results found for requested labs within last 8760 hours.   Recent Lipid Panel No results found for: CHOL, TRIG, HDL, CHOLHDL, LDLCALC, LDLDIRECT  Wt Readings from Last 3 Encounters:  01/05/19 177 lb 12.8 oz (80.6 kg)  11/16/17 173 lb 12.8 oz (78.8 kg)  10/13/17 174 lb (78.9 kg)     Objective:    Vital Signs:  BP (!) 126/92   Pulse 72   Ht 5' 9.5" (1.765 m)   Wt 177 lb 12.8 oz (80.6 kg)   BMI 25.88 kg/m    VITAL SIGNS:  reviewed  ASSESSMENT & PLAN:    BRADYCARDIA:    We talked about this at length.  He has no class I indication for a pacemaker.  No change in therapy is indicated.  SYNCOPE: It sounds like he had a vagal episode.  We talked about a wearable devices.  I did go through the description of his heart rate on his Fitbit but that does not include an EKG.  He has had vagal episodes before in response to beta blockers that was quite traumatic.  At this point he will let me know if he has any further events or if he gets a monitor.  ELEVATED CORONARY CALCIUM:  He does have some decreased exercise tolerance. I will bring the patient back for a POET (Plain Old Exercise Test). This will allow me to  screen for obstructive coronary disease, risk stratify and very importantly provide a prescription for exercise.  DYSLIPIDEMIA:  His MESA score was 11.  He will get a fasting lipid profile.  COVID-19 Education: Today we did talk about the value of virtual visits and how he might enhance the experience with devices at home particularly as we might be faced with a long pandemic.  Time:   Today, I have spent 25  minutes with the patient with telehealth technology discussing the above problems.     Medication Adjustments/Labs and Tests Ordered: Current medicines are reviewed at length with the patient today.  Concerns regarding medicines are outlined above.   Tests Ordered: Orders Placed This Encounter  Procedures  . Lipid panel  . EXERCISE TOLERANCE TEST (ETT)    Medication Changes: No orders of the defined types were placed in this encounter.   Follow Up:  Virtual Visit or In Person six months  Signed, Minus Breeding, MD  01/05/2019 8:23 AM    [place]

## 2019-01-05 ENCOUNTER — Telehealth (INDEPENDENT_AMBULATORY_CARE_PROVIDER_SITE_OTHER): Payer: Commercial Managed Care - PPO | Admitting: Cardiology

## 2019-01-05 ENCOUNTER — Telehealth: Payer: Self-pay | Admitting: Cardiology

## 2019-01-05 ENCOUNTER — Encounter: Payer: Self-pay | Admitting: Cardiology

## 2019-01-05 VITALS — BP 126/92 | HR 72 | Ht 69.5 in | Wt 177.8 lb

## 2019-01-05 DIAGNOSIS — R001 Bradycardia, unspecified: Secondary | ICD-10-CM

## 2019-01-05 DIAGNOSIS — E785 Hyperlipidemia, unspecified: Secondary | ICD-10-CM

## 2019-01-05 DIAGNOSIS — I2510 Atherosclerotic heart disease of native coronary artery without angina pectoris: Secondary | ICD-10-CM

## 2019-01-05 NOTE — Patient Instructions (Signed)
Medication Instructions:  Your physician recommends that you continue on your current medications as directed. Please refer to the Current Medication list given to you today.  If you need a refill on your cardiac medications before your next appointment, please call your pharmacy.   Lab work: Fasting Lipid Panel soon If you have labs (blood work) drawn today and your tests are completely normal, you will receive your results only by: MyChart Message (if you have MyChart) OR A paper copy in the mail If you have any lab test that is abnormal or we need to change your treatment, we will call you to review the results.  Testing/Procedures: Your physician has requested that you have an exercise tolerance test. For further information please visit [email or web address]. Please also follow instruction sheet, as given.   Follow-Up: At [facility], you and your health needs are our priority.  As part of our continuing mission to provide you with exceptional heart care, we have created designated Provider Care Teams.  These Care Teams include your primary Cardiologist (physician) and Advanced Practice Providers (APPs -  Physician Assistants and Nurse Practitioners) who all work together to provide you with the care you need, when you need it. You will need a follow up appointment in office in 6 months.  Please call our office 2 months in advance to schedule this appointment.  You may see Minus Breeding, MD or one of the following Advanced Practice Providers on your designated Care Team:   Rosaria Ferries, PA-C Jory Sims, DNP, ANP  Any other special instructions will be listed below:  You will need a Covid Test 3 days prior to your Treadmill test.

## 2019-01-05 NOTE — Telephone Encounter (Signed)
LVM for patient to call back and schedule GXT. °

## 2019-01-12 LAB — LIPID PANEL
Chol/HDL Ratio: 2 ratio (ref 0.0–5.0)
Cholesterol, Total: 136 mg/dL (ref 100–199)
HDL: 67 mg/dL (ref >39)
LDL Chol Calc (NIH): 54 mg/dL (ref 0–99)
Triglycerides: 78 mg/dL (ref 0–149)
VLDL Cholesterol Cal: 15 mg/dL (ref 5–40)

## 2019-01-13 ENCOUNTER — Telehealth (HOSPITAL_COMMUNITY): Payer: Self-pay

## 2019-01-13 NOTE — Telephone Encounter (Signed)
Encounter complete. 

## 2019-01-14 ENCOUNTER — Other Ambulatory Visit (HOSPITAL_COMMUNITY)
Admission: RE | Admit: 2019-01-14 | Discharge: 2019-01-14 | Disposition: A | Discharge status: Home / Self Care | Payer: Commercial Managed Care - PPO | Source: Ambulatory Visit | Attending: Cardiology | Admitting: Cardiology

## 2019-01-14 DIAGNOSIS — Z20828 Contact with and (suspected) exposure to other viral communicable diseases: Secondary | ICD-10-CM | POA: Insufficient documentation

## 2019-01-14 DIAGNOSIS — Z01812 Encounter for preprocedural laboratory examination: Secondary | ICD-10-CM | POA: Insufficient documentation

## 2019-01-16 LAB — NOVEL CORONAVIRUS, NAA (HOSP ORDER, SEND-OUT TO REF LAB; TAT 18-24 HRS): SARS-CoV-2, NAA: NOT DETECTED

## 2019-01-18 ENCOUNTER — Ambulatory Visit (HOSPITAL_COMMUNITY)
Admission: RE | Admit: 2019-01-18 | Discharge: 2019-01-18 | Disposition: A | Discharge status: Home / Self Care | Payer: Commercial Managed Care - PPO | Source: Ambulatory Visit | Attending: Cardiology | Admitting: Cardiology

## 2019-01-18 ENCOUNTER — Other Ambulatory Visit: Payer: Self-pay

## 2019-01-18 DIAGNOSIS — I2510 Atherosclerotic heart disease of native coronary artery without angina pectoris: Secondary | ICD-10-CM

## 2019-01-18 DIAGNOSIS — R001 Bradycardia, unspecified: Secondary | ICD-10-CM

## 2019-01-18 LAB — EXERCISE TOLERANCE TEST
Estimated workload: 10.6 METS
Exercise duration (min): 9 min
Exercise duration (sec): 22 s
MPHR: 163 {beats}/min
Peak HR: 176 {beats}/min
Percent HR: 107 %
Rest HR: 73 {beats}/min

## 2019-07-05 NOTE — Progress Notes (Signed)
Cardiology Office Note   Date:  07/06/2019   ID:  CHRISTINE SCHIEFELBEIN, DOB 05/14/1960, MRN [id number]  PCP:  Martha Clan, MD  Cardiologist:  Dr.  Antoine Poche  No chief complaint on file.    History of Present Illness: Jake Patterson is a 59 y.o. male from [place], [place] who presents for ongoing assessment and management of bradycardia.  Patient also had planned for a coronary CTA, given beta-blockers where he became very bradycardic and had asystole.  He required chest compressions.  He also had a repeat episode of this was a blood draw.  He was treated with glucagon and dopamine.  He was last seen by Dr. Antoine Poche on 01/05/2019.  At that time meeting complained of increased palpitations, usually feels it for 5 to 10 minutes every morning.  He does have vasovagal syncope with side of blood.  Dr. Antoine Poche spoke with him about his concerns that he may need a pacemaker.  Dr. Antoine Poche did not feel that he was a candidate for a pacemaker at this time.  His syncope was related to vasovagal episode.  He is to report any further syncopal events.  He was to have a exercise stress test due to elevated coronary calcium score for restratification.  This was completed on 01/18/2019 and was found to be normal without any evidence of ischemia.  He is doing very well! 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 He has lost weight and is less stressed.   Past Medical History:  Diagnosis Date  . BPH (benign prostatic hyperplasia)   . Cardiac arrest (HCC) 09/25/2017  . Family history of premature CAD   . Former light tobacco smoker    Less than 1/4 pack/day x 20 years  . Hard of hearing    Left ear  . Hyperlipidemia with target LDL less than 100   . Impaired fasting glucose     Past Surgical History:  Procedure Laterality Date  . TYMPANOMASTOIDECTOMY Left 07/19/2017   Procedure: LEFT CANAL WALL  TYMPANOMASTOIDECTOMY;  Surgeon: Newman Pies, MD;  Location: [facility];  Service: ENT;  Laterality: Left;  . WISDOM TOOTH EXTRACTION       Current Outpatient Medications  Medication Sig Dispense Refill  . Omega-3 Fatty Acids (FISH OIL PO) Take 2 capsules by mouth daily.     . Probiotic Product (PROBIOTIC DAILY PO) Take 1 capsule by mouth daily.     . Saw Palmetto, Serenoa repens, (SAW PALMETTO PO) Take 1 capsule by mouth daily.     . sildenafil (REVATIO) 20 MG tablet Take 20 mg by mouth daily as needed (for ED).     Marland Kitchen rosuvastatin (CRESTOR) 20 MG tablet Take 0.5 tablets (10 mg total) by mouth at bedtime. 30 tablet 11   No current facility-administered medications for this visit.    Allergies:   Metoprolol tartrate    Social History:  The patient  reports that he quit smoking about 18 years ago. His smoking use included cigarettes. He started smoking about 38 years ago. He has a 5.00 pack-year smoking history. He has never used smokeless tobacco. He reports current alcohol use. He reports that he does not use drugs.   Family History:  The patient's family history includes Alzheimer's disease in his mother; Coronary artery disease (age of onset: 12) in his father; Coronary artery disease (age of onset: 17) in  his paternal grandfather; Heart attack (age of onset: 13) in his father.    ROS: All other systems are reviewed and negative. Unless otherwise mentioned in H&P    PHYSICAL EXAM: VS:  BP 124/82   Pulse 68   Ht 5\' 9"  (1.753 m)   Wt 178 lb 3.2 oz (80.8 kg)   SpO2 96%   BMI 26.32 kg/m  , BMI Body mass index is 26.32 kg/m. GEN: Well nourished, well developed, in no acute distress HEENT: normal Neck: no JVD, carotid bruits, or masses Cardiac: RRR; no murmurs, rubs, or gallops,no edema  Respiratory:  Clear to auscultation bilaterally, normal work of breathing GI: soft, nontender, nondistended, + BS MS: no deformity or atrophy Skin: warm and dry, no rash Neuro:   Strength and sensation are intact Psych: euthymic mood, full affect   EKG:  Not completed this office visit.   Recent Labs: No results found for requested labs within last 8760 hours.    Lipid Panel    Component Value Date/Time   CHOL 136 01/12/2019 0819   TRIG 78 01/12/2019 0819   HDL 67 01/12/2019 0819   CHOLHDL 2.0 01/12/2019 0819   LDLCALC 54 01/12/2019 0819      Wt Readings from Last 3 Encounters:  07/06/19 178 lb 3.2 oz (80.8 kg)  01/05/19 177 lb 12.8 oz (80.6 kg)  11/16/17 173 lb 12.8 oz (78.8 kg)      Other studies Reviewed: Study Highlights 01/18/2019  Excellent exercise capacity. Normal BP response to exercise. No ischemia.  Stress Findings  ECG Baseline ECG exhibits normal sinus rhythm..  Stress Findings The patient exercised following the Bruce protocol.   The patient reported no symptoms during the stress test.   The test was stopped because  the patient complained of fatigue and shortness of breath.   85% of maximum heart rate was achieved after 4.2 minutes.  Recovery time:  7 minutes.  Response to Stress Arrhythmias during stress:  none.   Arrhythmias during recovery:  PVCs.     Arrhythmias were not significant.   ECG was interpretable and conclusive      ASSESSMENT AND PLAN:  1. Bradycardia: No issues with this. He is very active, eating healthy and medically compliant.  He has no complaints. No changes or ischemic testing at this time.  2. Hyperlipidemia: Taking statin as directed. Labs per PCP.    Current medicines are reviewed at length with the patient today.  I have spent 20 minutes dedicated to the care of this patient on the date of this encounter to include pre-visit review of records, assessment, management and diagnostic testing,with shared decision making.  Labs/ tests ordered today include: None   Phill Myron. West Pugh, ANP, AACC   07/06/2019 3:25 PM    [facility] [place] [address] Office  [phone] Fax [phone]  Notice: This dictation was prepared with Dragon dictation along with smaller phrase technology. Any transcriptional errors that result from this process are unintentional and may not be corrected upon review.

## 2019-07-06 ENCOUNTER — Other Ambulatory Visit: Payer: Self-pay

## 2019-07-06 ENCOUNTER — Encounter: Payer: Self-pay | Admitting: Adult Health

## 2019-07-06 ENCOUNTER — Ambulatory Visit: Payer: Commercial Managed Care - PPO | Admitting: Adult Health

## 2019-07-06 VITALS — BP 124/82 | HR 68 | Ht 69.0 in | Wt 178.2 lb

## 2019-07-06 DIAGNOSIS — E7800 Pure hypercholesterolemia, unspecified: Secondary | ICD-10-CM

## 2019-07-06 DIAGNOSIS — R001 Bradycardia, unspecified: Secondary | ICD-10-CM

## 2019-07-06 NOTE — Patient Instructions (Signed)
Medication Instructions:  No changes *If you need a refill on your cardiac medications before your next appointment, please call your pharmacy*  Lab Work: None ordered this visit  Testing/Procedures: None ordered this visit  Follow-Up: At [facility], you and your health needs are our priority.  As part of our continuing mission to provide you with exceptional heart care, we have created designated Provider Care Teams.  These Care Teams include your primary Cardiologist (physician) and Advanced Practice Providers (APPs -  Physician Assistants and Nurse Practitioners) who all work together to provide you with the care you need, when you need it.  Your next appointment:   1 year(s) You will receive a reminder letter in the mail or a MyChart notification two months in advance. If you don't receive a letter, please call our office to schedule the follow-up appointment.  The format for your next appointment:   In Person  Provider:   Rollene Rotunda, MD

## 2019-07-06 NOTE — Progress Notes (Signed)
Formatting of this note is different from the original.  Images from the original note were not included.  Cardiology Office Note    Date:  07/06/2019     ID:  Stanley Mullins, DOB December 17, 1960, MRN [id number]    PCP:  Martha Clan, MD   Cardiologist:  Dr.  Antoine Poche   No chief complaint on file.      History of Present Illness:  Stanley Mullins is a 59 y.o. male from [place], [place] who presents for ongoing assessment and management of bradycardia.  Patient also had planned for a coronary CTA, given beta-blockers where he became very bradycardic and had asystole.  He required chest compressions.  He also had a repeat episode of this was a blood draw.  He was treated with glucagon and dopamine.    He was last seen by Dr. Antoine Poche on 01/05/2019.  At that time meeting complained of increased palpitations, usually feels it for 5 to 10 minutes every morning.  He does have vasovagal syncope with side of blood.  Dr. Antoine Poche spoke with him about his concerns that he may need a pacemaker.  Dr. Antoine Poche did not feel that he was a candidate for a pacemaker at this time.  His syncope was related to vasovagal episode.  He is to report any further syncopal events.    He was to have a exercise stress test due to elevated coronary calcium score for restratification.  This was completed on 01/18/2019 and was found to be normal without any evidence of ischemia.    He is doing very well! 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 He has lost weight and is less stressed.     Past Medical History:   Diagnosis Date   ? BPH (benign prostatic hyperplasia)    ? Cardiac arrest (HCC) 09/25/2017   ? Family history of premature CAD    ? Former light tobacco smoker     Less than 1/4 pack/day x 20 years   ? Hard of hearing     Left ear   ? Hyperlipidemia with target LDL less than 100    ? Impaired fasting glucose      Past Surgical  History:   Procedure Laterality Date   ? TYMPANOMASTOIDECTOMY Left 07/19/2017    Procedure: LEFT CANAL WALL TYMPANOMASTOIDECTOMY;  Surgeon: Newman Pies, MD;  Location: [facility];  Service: ENT;  Laterality: Left;   ? WISDOM TOOTH EXTRACTION       Current Outpatient Medications   Medication Sig Dispense Refill   ? Omega-3 Fatty Acids (FISH OIL PO) Take 2 capsules by mouth daily.      ? Probiotic Product (PROBIOTIC DAILY PO) Take 1 capsule by mouth daily.      ? Saw Palmetto, Serenoa repens, (SAW PALMETTO PO) Take 1 capsule by mouth daily.      ? sildenafil (REVATIO) 20 MG tablet Take 20 mg by mouth daily as needed (for ED).      ? rosuvastatin (CRESTOR) 20 MG tablet Take 0.5 tablets (10 mg total) by mouth at bedtime. 30 tablet 11     No current facility-administered medications for this visit.     Allergies:   Metoprolol tartrate     Social History:  The patient  reports that he quit smoking about 18 years ago. His smoking use included cigarettes. He started smoking about  38 years ago. He has a 5.00 pack-year smoking history. He has never used smokeless tobacco. He reports current alcohol use. He reports that he does not use drugs.     Family History:  The patient's family history includes Alzheimer's disease in his mother; Coronary artery disease (age of onset: 85) in his father; Coronary artery disease (age of onset: 56) in his paternal grandfather; Heart attack (age of onset: 59) in his father.     ROS: All other systems are reviewed and negative. Unless otherwise mentioned in H&P     PHYSICAL EXAM:  VS:  BP 124/82   Pulse 68   Ht 5\' 9"  (1.753 m)   Wt 178 lb 3.2 oz (80.8 kg)   SpO2 96%   BMI 26.32 kg/m  , BMI Body mass index is 26.32 kg/m.  GEN: Well nourished, well developed, in no acute distress  HEENT: normal  Neck: no JVD, carotid bruits, or masses  Cardiac: RRR; no murmurs, rubs, or gallops,no edema   Respiratory:  Clear to auscultation bilaterally, normal work of breathing  GI: soft,  nontender, nondistended, + BS  MS: no deformity or atrophy  Skin: warm and dry, no rash  Neuro:  Strength and sensation are intact  Psych: euthymic mood, full affect    EKG:  Not completed this office visit.     Recent Labs:  No results found for requested labs within last 8760 hours.     Lipid Panel    Component Value Date/Time    CHOL 136 01/12/2019 0819    TRIG 78 01/12/2019 0819    HDL 67 01/12/2019 0819    CHOLHDL 2.0 01/12/2019 0819    LDLCALC 54 01/12/2019 0819         Wt Readings from Last 3 Encounters:   07/06/19 178 lb 3.2 oz (80.8 kg)   01/05/19 177 lb 12.8 oz (80.6 kg)   11/16/17 173 lb 12.8 oz (78.8 kg)       Other studies Reviewed:  Study Highlights 01/18/2019    Excellent exercise capacity. Normal BP response to exercise. No ischemia.   Stress Findings    ECG Baseline ECG exhibits normal sinus rhythm..   Stress Findings The patient exercised following the Bruce protocol.    The patient reported no symptoms during the stress test.     The test was stopped because  the patient complained of fatigue and shortness of breath.     85% of maximum heart rate was achieved after 4.2 minutes.  Recovery time:  7 minutes.   Response to Stress Arrhythmias during stress:  none.    Arrhythmias during recovery:  PVCs.      Arrhythmias were not significant.    ECG was interpretable and conclusive     ASSESSMENT AND PLAN:    1. Bradycardia: No issues with this. He is very active, eating healthy and medically compliant.  He has no complaints. No changes or ischemic testing at this time.    2. Hyperlipidemia: Taking statin as directed. Labs per PCP.     Current medicines are reviewed at length with the patient today.  I have spent 20 minutes dedicated to the care of this patient on the date of this encounter to include pre-visit review of records, assessment, management and diagnostic testing,with shared decision making.    Labs/ tests ordered today include: None     Bettey Mare. Liborio Nixon, ANP, AACC    07/06/2019 3:25 PM      [facility]  [address]  Office [phone] Fax [phone]    Notice: This dictation was prepared with Dragon dictation along with smaller phrase technology. Any transcriptional errors that result from this process are unintentional and may not be corrected upon review.  Electronically signed by Jodelle Gross, NP at 07/06/2019  3:29 PM EDT

## 2020-01-16 IMAGING — CT CT TEMPORAL BONES W/O CM
3 of 8 series · 11 of 30 positions shown, 13 images · non-contrast
Comparison: None.

CLINICAL DATA: Left-sided hearing loss beginning 6 months ago.

EXAM:
CT TEMPORAL BONES WITHOUT CONTRAST
TECHNIQUE: Axial and coronal plane CT imaging of the petrous temporal bones was
performed with thin-collimation image reconstruction. No intravenous
contrast was administered. Multiplanar CT image reconstructions were
also generated.

[Series 13: temperal bones 0.80 hr60 s3 coronal ax mag right · coronal · 0.16mm/px · 2 of 136 slices shown]
[im 46/136  bone]
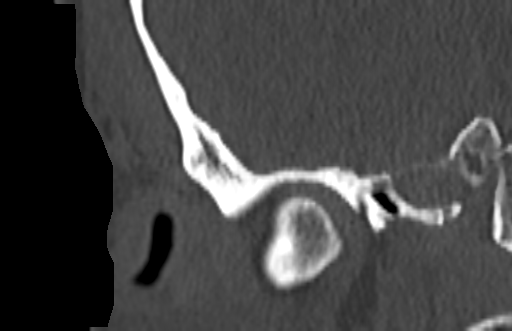
[im 91/136  bone]
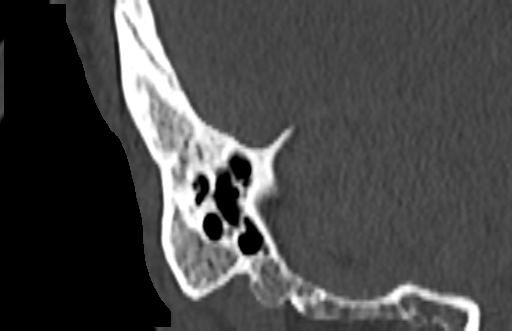

[Series 15: temperal bones 0.80 hr60 s3 coronal ax mag left · coronal · 0.17mm/px · 3 of 143 slices shown]
[im 36/143  bone]
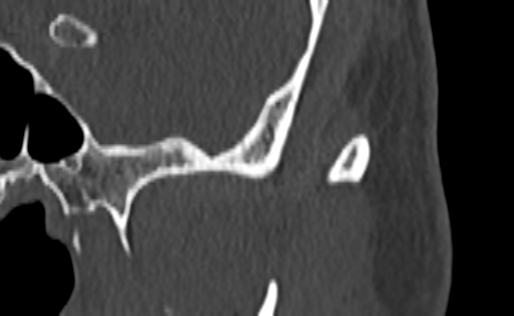
[im 72/143  bone]
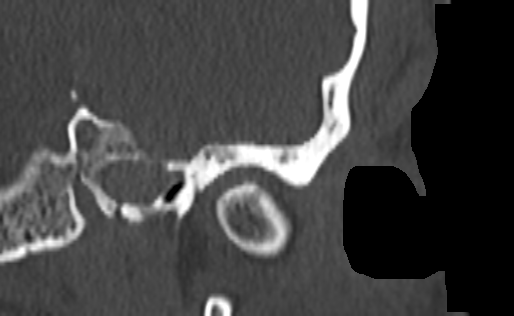
[im 107/143  bone]
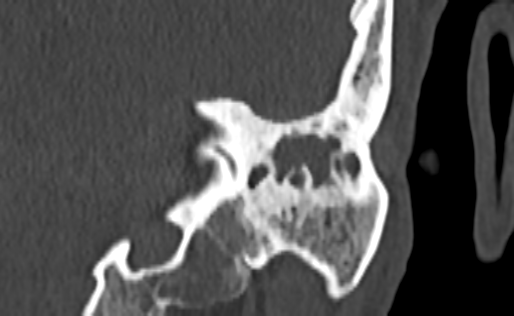

[Series 17: temperal bones 0.80 hr60 s3 coronal cor bone · coronal · 0.21mm/px · 6 of 257 slices shown, 8 images]
[im 37/257  brain]
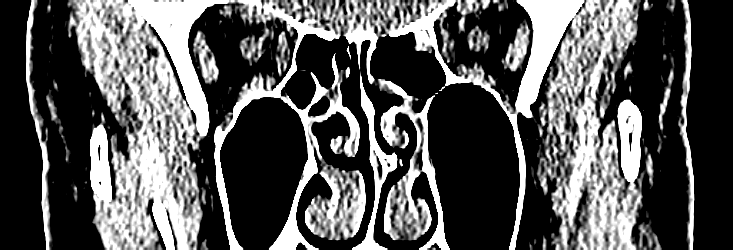
[im 37/257  bone]
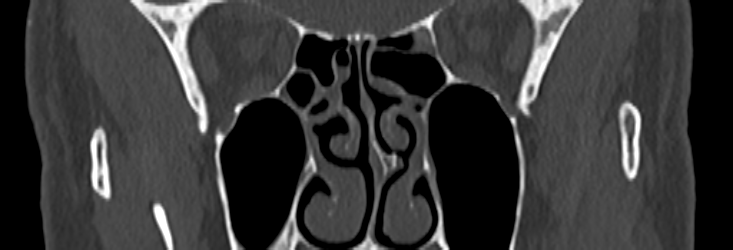
[im 74/257  bone]
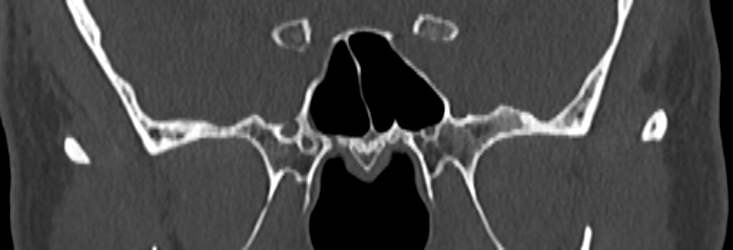
[im 110/257  bone]
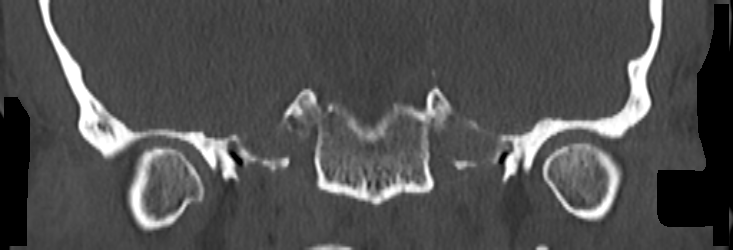
[im 147/257  bone]
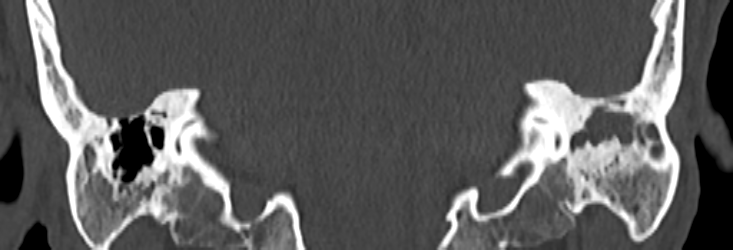
[im 183/257  brain]
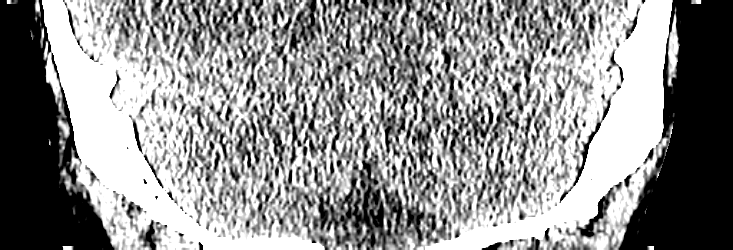
[im 183/257  bone]
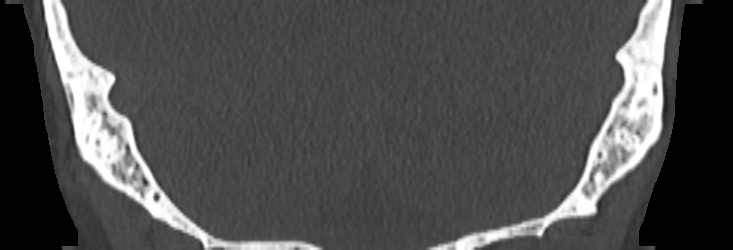
[im 220/257  bone]
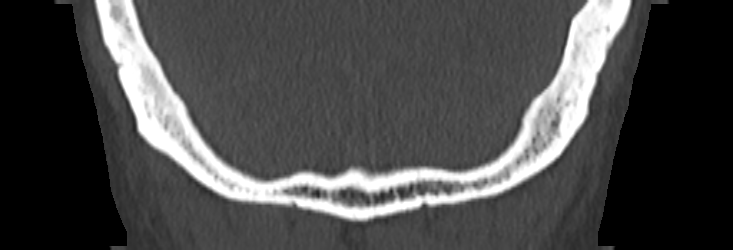

[11 of 30 positions shown; findings below may reference images not displayed]

FINDINGS: Limited imaging the brain is unremarkable. The visualized paranasal
sinuses are clear. Visualized globes and orbits are within normal
limits.

The right external auditory canal is within normal limits. The
tympanic membrane is visualized and is normal. Middle ear ossicles
are normally formed and articulating. The oval window is patent. The
epitympanum and mastoid air cells are clear. The inner ear
structures are normally formed. The lateral and superior
semicircular canals are covered. Facial nerve canal is intact.
Cochlea and vestibule are within normal limits. The internal
auditory canal and vestibular aqueduct are normal.

The left external auditory canal is within normal limits. Soft
tissue surrounds the left middle ear ossicles. The ossicles are
intact. The oval window is patent. Soft tissue extends into
Prussak's space and superiorly in the epitympanum. There is diffuse
opacification of left mastoid air cells. With the tegmen tympani is
markedly thinned and may be dehiscent. Cochlea and vestibule are
within normal limits. The superior and lateral semicircular canals
are covered. The facial nerve canal is intact. The internal auditory
canal and vestibular aqueduct are normal.
IMPRESSION: 1. Extensive soft tissue within the left middle ear cavity extending
into the epitympanum and filling Prussak's space consistent with
acquired cholesteatoma.
2. Diffuse soft tissue or fluid throughout the mastoid air cells
representing extension of cholesteatoma versus postobstructive
mastoid effusion.
3. Marked thinning and possible erosion of the tegmen tympani on the
left.
4. Right temporal bone is within normal limits.

## 2021-04-18 ENCOUNTER — Encounter: Payer: Self-pay | Admitting: Cardiology

## 2022-08-26 ENCOUNTER — Encounter: Admit: 2022-08-26 | Discharge: 2022-08-26 | Payer: PRIVATE HEALTH INSURANCE | Attending: Otolaryngology

## 2022-08-26 DIAGNOSIS — H65491 Other chronic nonsuppurative otitis media, right ear: Secondary | ICD-10-CM

## 2022-08-26 MED ORDER — CIPROFLOXACIN-DEXAMETHASONE 0.3-0.1 % OT SUSP
Freq: Every day | OTIC | 0 refills | Status: AC
Start: 2022-08-26 — End: 2022-08-31

## 2022-08-26 NOTE — Progress Notes (Signed)
HPI:  Stanley Mullins is a 62 y.o. male seen New    Chief Complaint   Patient presents with    Ear Problem     Patient presents today with c/o R ear decreased hearing but denies pain,no improvement with antibiotic . Patient has history of cholesteatoma of R ear .       62 year old male seen for a new patient referral evaluation with concern of right-sided hearing loss.  Starting approximately 2 to 3 weeks ago he came down with a sinusitis/URI event with progressively worsening right-sided hearing loss and ear pressure.  He denies any otalgia or otorrhea.  He just recently moved here from [facility] and has not been established with primary care but did see a couple urgent care visits over the last couple weeks for which she had 2 separate rounds of oral antibiotics and has been using recommended Flonase and saline but still with consistent right-sided hearing loss without improvement.  He also admits to having a history of a left-sided cholesteatoma that was diagnosed in 2019 and eventually status post surgical excision.  He describes what sounds to be a left-sided tympanomastoidectomy with removal of his ossicles as they were eroded.  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    Past Medical History, Past Surgical History, Family history, Social History, and Medications were all reviewed with the patient today and updated as necessary.     Allergies   Allergen Reactions    Beta Adrenergic Blockers Anaphylaxis and Other (See Comments)     Other Reaction(s): Other (See Comments)    Patient's heart "flatlined" and had to receive CPR.  Do not use beta blockers.     Patient's heart "flatlined" and had to receive CPR.  Do not use beta blockers.    Amoxicillin Other (See Comments)       There is no problem list on file for this patient.      Current Outpatient Medications   Medication Sig    rosuvastatin (CRESTOR)  10 MG tablet Take 1 tablet by mouth nightly    sildenafil (VIAGRA) 100 MG tablet 1 tablet as needed Once a day    ciprofloxacin-dexAMETHasone (CIPRODEX) 0.3-0.1 % otic suspension Place 4 drops into the right ear daily for 5 days     No current facility-administered medications for this visit.       Past Medical History:   Diagnosis Date    Hearing loss Spring 2019    Surgery to remove cholesteatoma       History reviewed. No pertinent surgical history.    Social History     Tobacco Use    Smoking status: Former     Current packs/day: 0.00     Types: Cigarettes     Quit date: 06/13/2001     Years since quitting: 21.2     Passive exposure: Past    Smokeless tobacco: Never    Tobacco comments:     light smoking in social settings   Substance Use Topics    Alcohol use: Yes     Alcohol/week: 4.0 standard drinks of alcohol     Types: 4 Cans of beer per week       Family History   Problem Relation Age of Onset    Heart Failure Father         died playing golf at 48  ROS:    Review of Systems   Constitutional:  Negative for chills and fever.   HENT:  Positive for hearing loss. Negative for congestion, facial swelling, nosebleeds, sinus pressure, sinus pain and sneezing.    Eyes:  Negative for pain, discharge and itching.   Respiratory:  Negative for apnea, cough, choking, shortness of breath, wheezing and stridor.    Cardiovascular:  Negative for chest pain.   Gastrointestinal:  Negative for constipation, diarrhea and nausea.   Endocrine: Negative for polyuria.   Genitourinary:  Negative for difficulty urinating and flank pain.   Musculoskeletal:  Negative for arthralgias, myalgias and neck stiffness.   Skin:  Negative for rash and wound.   Allergic/Immunologic: Negative for environmental allergies.   Neurological:  Negative for dizziness, facial asymmetry and headaches.   Hematological:  Negative for adenopathy. Does not bruise/bleed easily.   Psychiatric/Behavioral:  Negative for agitation, behavioral problems and  confusion.            PHYSICAL EXAM:    BP 138/84   Resp 18   Ht 1.753 m (5\' 9" )   Wt 82.9 kg (182 lb 12.8 oz)   BMI 26.99 kg/m     Physical Exam  Vitals and nursing note reviewed.   Constitutional:       Appearance: Normal appearance.   HENT:      Head: Normocephalic and atraumatic.      Right Ear: Ear canal and external ear normal. A middle ear effusion is present. There is no impacted cerumen. Tympanic membrane is not perforated, erythematous or retracted.      Left Ear: Tympanic membrane, ear canal and external ear normal.  No middle ear effusion. There is no impacted cerumen. Tympanic membrane is not perforated, erythematous or retracted.      Nose: Septal deviation present. No congestion or rhinorrhea.      Comments: Left-sided DNS posteriorly     Mouth/Throat:      Mouth: Mucous membranes are moist.      Pharynx: Oropharynx is clear. No oropharyngeal exudate or posterior oropharyngeal erythema.   Eyes:      General: No scleral icterus.  Cardiovascular:      Rate and Rhythm: Normal rate and regular rhythm.      Heart sounds: Normal heart sounds, S1 normal and S2 normal.   Pulmonary:      Effort: Pulmonary effort is normal.   Musculoskeletal:      Cervical back: Normal range of motion and neck supple. No rigidity.   Lymphadenopathy:      Cervical: No cervical adenopathy.   Skin:     General: Skin is warm and dry.   Neurological:      Mental Status: He is alert and oriented to person, place, and time.   Psychiatric:         Mood and Affect: Mood normal.         Behavior: Behavior normal.            CT TEMPORAL BONES WO CONTRAST  Order: [id number]  Narrative    CLINICAL DATA:  Left-sided hearing loss beginning 6 months ago.    EXAM:  CT TEMPORAL BONES WITHOUT CONTRAST    TECHNIQUE:  Axial and coronal plane CT imaging of the petrous temporal bones was  performed with thin-collimation image reconstruction. No intravenous  contrast was administered. Multiplanar CT image reconstructions were  also  generated.    COMPARISON:  None.    FINDINGS:  Limited imaging the brain is  unremarkable. The visualized paranasal  sinuses are clear. Visualized globes and orbits are within normal  limits.    The right external auditory canal is within normal limits. The  tympanic membrane is visualized and is normal. Middle ear ossicles  are normally formed and articulating. The oval window is patent. The  epitympanum and mastoid air cells are clear. The inner ear  structures are normally formed. The lateral and superior  semicircular canals are covered. Facial nerve canal is intact.  Cochlea and vestibule are within normal limits. The internal  auditory canal and vestibular aqueduct are normal.    The left external auditory canal is within normal limits. Soft  tissue surrounds the left middle ear ossicles. The ossicles are  intact. The oval window is patent. Soft tissue extends into  Prussak's space and superiorly in the epitympanum. There is diffuse  opacification of left mastoid air cells. With the tegmen tympani is  markedly thinned and may be dehiscent. Cochlea and vestibule are  within normal limits. The superior and lateral semicircular canals  are covered. The facial nerve canal is intact. The internal auditory  canal and vestibular aqueduct are normal.    IMPRESSION:  1. Extensive soft tissue within the left middle ear cavity extending  into the epitympanum and filling Prussak's space consistent with  acquired cholesteatoma.  2. Diffuse soft tissue or fluid throughout the mastoid air cells  representing extension of cholesteatoma versus postobstructive  mastoid effusion.  3. Marked thinning and possible erosion of the tegmen tympani on the  left.  4. Right temporal bone is within normal limits.      Electronically Signed    By: Marin Roberts M.D.    On: 06/23/2017 14:08    PROCEDURE: Nasal endoscopy  INDICATIONS: Eustachian tube dysfunction  DESCRIPTION: After verbal consent was obtained and a timeout was  performed, the 0 degree rigid nasal endoscope was advanced into both nares. The septum was deviated to the left posteriorly with bone spur narrowing the left middle meatus and eustachian tube orifice w/ no perforations. There were no masses seen along the nasal floor or within the nasopharynx. On the R side, the mucosa was healthy and the turbinates were intact. The middle meatus was clear w/ no edema, polyps or mucopurulence and the sphenoethmoid recess was was clear. On the L side, the mucosa was healthy and the turbinates were intact. The middle meatus was clear w/ no edema, polyps or mucopurulence and the sphenoethmoid recess was was clear. The scope was then removed and the patient tolerated the procedure well w/ no complications.     PROCEDURE: Right-sided myringotomy w/ aspiration of middle ear effusion  INDICATIONS: Right-sided chronic otitis media with effusion  DESCRIPTION: Verbal consent obtained. Timeout performed. After cleaning out both EACs, I anesthetized right-sided TMs w/ topical Phenol. Next, I used a myringotomy blade to make a radial incision along the anteroinferior quadrant of right-sided TM and upon incision copious straw-colored middle ear effusion was evacuated using a Frazier suction.  Once the middle ear space was cleared of fluid Ciprodex drops were applied.  There was minimal bleeding and the patient tolerated the procedure well w/ no complications.      ASSESSMENT and PLAN        ICD-10-CM    1. COME (chronic otitis media with effusion), right  H65.491 INCISION EARDRUM,ASPIR      2. ETD (Eustachian tube dysfunction), bilateral  H69.93 NASAL ENDOSCOPY,DX      3. History of cholesteatoma  Z86.69       4. 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  Symptomatically his nose and sinuses improved but still with ongoing hearing loss and middle ear fluid today on exam.  As he had failed  medical therapy he underwent a recommended right-sided myringotomy with aspiration which was successful with copious serous middle ear effusion evacuated.  Hearing loss significantly improved.  Left-sided ear exam status post history of cholesteatoma and prior surgery was essentially unremarkable.    Given right-sided new eustachian tube concerns and a prior history of left-sided cholesteatoma and nasal endoscopy was performed which was essentially unremarkable with no obstructing nasopharyngeal mass lesions.  He does have a notable leftward deviation of the nasal septum with posterior bone spur.    I will have him continue with optimization of his eustachian tube with medical therapy daily Flonase saline and oral antihistamine.  He will use Ciprodex drops to his right ear for 5 days.  1 month follow-up for repeat exam to ensure closure of myringotomy site and no recurrence of middle ear effusion.  If there is recurrence of tympanostomy tube would be advised.    Audelia Acton, DO  08/26/2022

## 2022-09-03 ENCOUNTER — Encounter: Payer: PRIVATE HEALTH INSURANCE | Attending: Otolaryngology

## 2022-09-30 ENCOUNTER — Ambulatory Visit: Admit: 2022-09-30 | Discharge: 2022-09-30 | Payer: PRIVATE HEALTH INSURANCE | Attending: Otolaryngology

## 2022-09-30 DIAGNOSIS — H6993 Unspecified Eustachian tube disorder, bilateral: Secondary | ICD-10-CM

## 2022-09-30 NOTE — Progress Notes (Signed)
HPI:  Stanley Mullins is a 62 y.o. male seen Established   Chief Complaint   Patient presents with    Follow-up     Patient presents today for 1 MO ear recheck . Patient states he has done well since placement .        08/26/2022: 62 year old male seen for a new patient referral evaluation with concern of right-sided hearing loss.  Starting approximately 2 to 3 weeks ago he came down with a sinusitis/URI event with progressively worsening right-sided hearing loss and ear pressure.  He denies any otalgia or otorrhea.  He just recently moved here from [facility] and has not been established with primary care but did see a couple urgent care visits over the last couple weeks for which she had 2 separate rounds of oral antibiotics and has been using recommended Flonase and saline but still with consistent right-sided hearing loss without improvement.  He also admits to having a history of a left-sided cholesteatoma that was diagnosed in 2019 and eventually status post surgical excision.  He describes what sounds to be a left-sided tympanomastoidectomy with removal of his ossicles as they were eroded.  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    09/30/2022 interval history: Here for follow-up and states his right ear has returned back to baseline following right-sided myringotomy 08/26/2022.  No longer having hearing loss or ear fullness.  He is here today for a follow-up ear exam.    Past Medical History, Past Surgical History, Family history, Social History, and Medications were all reviewed with the patient today and updated as necessary.     Allergies   Allergen Reactions    Beta Adrenergic Blockers Anaphylaxis and Other (See Comments)     Other Reaction(s): Other (See Comments)    Patient's heart "flatlined" and had to receive CPR.  Do not use beta blockers.     Patient's heart "flatlined" and had to receive  CPR.  Do not use beta blockers.    Amoxicillin Other (See Comments)       There is no problem list on file for this patient.      Current Outpatient Medications   Medication Sig    rosuvastatin (CRESTOR) 10 MG tablet Take 1 tablet by mouth nightly    sildenafil (VIAGRA) 100 MG tablet 1 tablet as needed Once a day     No current facility-administered medications for this visit.       Past Medical History:   Diagnosis Date    Hearing loss Spring 2019    Surgery to remove cholesteatoma       History reviewed. No pertinent surgical history.    Social History     Tobacco Use    Smoking status: Former     Current packs/day: 0.00     Types: Cigarettes     Quit date: 06/13/2001     Years since quitting: 21.3     Passive exposure: Past    Smokeless tobacco: Never    Tobacco comments:     light smoking in social settings   Substance Use Topics    Alcohol use: Yes     Alcohol/week: 4.0 standard drinks of alcohol     Types: 4 Cans of beer per week       Family History   Problem Relation Age of Onset    Heart Failure Father  died playing golf at 40        ROS:    Review of Systems   Constitutional:  Negative for chills and fever.   HENT:  Negative for congestion, facial swelling, hearing loss, nosebleeds, sinus pressure, sinus pain and sneezing.    Eyes:  Negative for pain, discharge and itching.   Respiratory:  Negative for apnea, cough, choking, shortness of breath, wheezing and stridor.    Cardiovascular:  Negative for chest pain.   Gastrointestinal:  Negative for constipation, diarrhea and nausea.   Endocrine: Negative for polyuria.   Genitourinary:  Negative for difficulty urinating and flank pain.   Musculoskeletal:  Negative for arthralgias, myalgias and neck stiffness.   Skin:  Negative for rash and wound.   Allergic/Immunologic: Negative for environmental allergies.   Neurological:  Negative for dizziness, facial asymmetry and headaches.   Hematological:  Negative for adenopathy. Does not bruise/bleed easily.    Psychiatric/Behavioral:  Negative for agitation, behavioral problems and confusion.            PHYSICAL EXAM:    BP 114/72   Resp 19   Ht 1.753 m (5\' 9" )   Wt 82.6 kg (182 lb)   BMI 26.88 kg/m     Physical Exam  Vitals and nursing note reviewed.   Constitutional:       Appearance: Normal appearance.   HENT:      Head: Normocephalic and atraumatic.      Right Ear: Tympanic membrane, ear canal and external ear normal. No middle ear effusion. There is no impacted cerumen. Tympanic membrane is not perforated, erythematous or retracted.      Left Ear: Tympanic membrane, ear canal and external ear normal.  No middle ear effusion. There is no impacted cerumen. Tympanic membrane is not perforated, erythematous or retracted.      Ears:      Comments: Right-sided TM well-healed myringotomy site no residual perforation or middle ear effusion.     Nose: Nose normal. No congestion or rhinorrhea.      Mouth/Throat:      Mouth: Mucous membranes are moist.      Pharynx: Oropharynx is clear. No oropharyngeal exudate or posterior oropharyngeal erythema.   Eyes:      General: No scleral icterus.  Cardiovascular:      Rate and Rhythm: Normal rate and regular rhythm.      Heart sounds: Normal heart sounds, S1 normal and S2 normal.   Pulmonary:      Effort: Pulmonary effort is normal.   Musculoskeletal:      Cervical back: Normal range of motion and neck supple. No rigidity.   Lymphadenopathy:      Cervical: No cervical adenopathy.   Skin:     General: Skin is warm and dry.   Neurological:      Mental Status: He is alert and oriented to person, place, and time.   Psychiatric:         Mood and Affect: Mood normal.         Behavior: Behavior normal.                ASSESSMENT and PLAN        ICD-10-CM    1. ETD (Eustachian tube dysfunction), bilateral  H69.93       2. COME (chronic otitis media with effusion), right  H65.491       3. History of cholesteatoma  Z86.69           Patient  was reassured of well-healed right-sided  myringotomy site and there is no residual TM perforation or residual middle ear effusion.  Symptomatically he feels back to normal baseline with good hearing on the right side.  If he experiences onset of eustachian tube dysfunction or ear fullness again he will reinitiate steroid allergy nasal sprays and antihistamines and call for follow-up as needed.  Will plan for a 1 year annual follow-up for repeat exam and in the setting of having left-sided cholesteatoma history.    Audelia Acton, DO  09/30/2022

## 2022-11-12 ENCOUNTER — Encounter: Admit: 2022-11-12 | Discharge: 2022-11-12 | Payer: PRIVATE HEALTH INSURANCE | Attending: Family Medicine

## 2022-11-12 DIAGNOSIS — E785 Hyperlipidemia, unspecified: Secondary | ICD-10-CM

## 2022-11-12 NOTE — Assessment & Plan Note (Signed)
Continue crestor. F/u lipids. Encouraged to reduce red meat, fried foods, fast foods, butter, mayonnaise and dairy products; increase daily exercise and limit intake of egg yolks to < 7 egg yolks weekly.

## 2022-11-12 NOTE — Assessment & Plan Note (Signed)
-   Continue sildenafil °

## 2022-11-12 NOTE — Progress Notes (Signed)
[name]  _______________________________________  Cathlean Marseilles, MD                 [address]        Llana Aliment. Shrouder, MD                     [place], [place] [address]                                                                                    Phone: [phone]                                                                                    Fax: [phone]    Stanley Mullins is a 62 y.o. male who is seen for evaluation of   Chief Complaint   Patient presents with    Establish Care     Prev with [facility] in [facility], here to est care. Pt is fasting today        HPI:   Stanley Mullins is a 62 y/o M with h/o dyslipidemia, here to establish care. New to area from [facility].     - dyslipidemia- compliant with crestor. Follows a pretty strict diet. Very active.     - ED- on sildenafil.        R  White blood cells  3.4 - 10.8 x10E3/uL 7.2   RBC  4.14 - 5.80 x10E6/uL 4.32   Hgb  13.0 - 17.7 g/dL 34.7   Hct  42.5 - 95.6 % 41.4   MCV  79 - 97 fL 96   MCH  26.6 - 33.0 pg 31.5   MCHC  31.5 - 35.7 g/dL 38.7   RDW  56.4 - 33.2 % 13.1   Platelets  150 - 450 x10E3/uL 248     Component  Ref Range & Units 10 mo ago   Glucose  70 - 99 mg/dL 93   BUN  8 - 27 mg/dL 14   Creat  9.51 - 8.84 mg/dL 1.66   Egfr  >06 TK/ZSW/1.09 79   BUN/Creat Ratio  10 - 24 13   Sodium  134 - 144 mmol/L 139   Potassium  3.5 - 5.2 mmol/L 4.4   Chloride  96 - 106 mmol/L 101   Carbon Dioxide  20 - 29 mmol/L 22   Calcium  8.6 - 10.2 mg/dL 9.4   Protein Total  6.0 - 8.5 g/dL 7.3   Albumin  3.8 - 4.9 g/dL 4.6   Globulin Total  1.5 - 4.5 g/dL 2.7   A/G Ratio  1.2 - 2.2 1.7   Bili Total  0.0 - 1.2 mg/dL 0.5   Alk Phosphatase  44 - 121 IU/L 55   AST  0 - 40 IU/L 32  ALT  0 - 44 IU/L 22     Cholesterol, Total  100 - 199 mg/dL 409   Triglycerides  0 - 149 mg/dL 46   HDL Cholesterol  >81 mg/dL 82   VLDL Cholesterol Cal  5 - 40 mg/dL 10   LDL Chol Calc (NIH)  0 - 99 mg/dL 65     Component  Ref Range & Units 10 mo ago    PSA  0.0 - 4.0 ng/mL 0.3       Review of Systems:  Review of Systems   Constitutional:  Negative for activity change, appetite change, chills, diaphoresis, fatigue, fever and unexpected weight change.   HENT:  Negative for congestion, ear discharge, ear pain, facial swelling, hearing loss, mouth sores, nosebleeds, postnasal drip, rhinorrhea, sinus pressure, sinus pain, sore throat and tinnitus.    Eyes:  Negative for pain, discharge, redness, itching and visual disturbance.   Respiratory:  Negative for cough, chest tightness, shortness of breath, wheezing and stridor.    Cardiovascular:  Negative for chest pain, palpitations and leg swelling.   Gastrointestinal:  Negative for abdominal distention, abdominal pain, blood in stool, constipation, diarrhea, nausea and vomiting.   Endocrine: Negative for cold intolerance, heat intolerance, polydipsia, polyphagia and polyuria.   Genitourinary:  Negative for decreased urine volume, difficulty urinating, dysuria, flank pain, frequency, hematuria and urgency.   Musculoskeletal:  Negative for arthralgias, back pain, gait problem, joint swelling, myalgias and neck pain.   Skin:  Negative for color change, pallor, rash and wound.   Neurological:  Negative for dizziness, tremors, seizures, syncope, facial asymmetry, speech difficulty, weakness, light-headedness, numbness and headaches.   Psychiatric/Behavioral:  Negative for agitation, behavioral problems, confusion, hallucinations, self-injury, sleep disturbance and suicidal ideas. The patient is not nervous/anxious.         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       Past Surgical History:   Procedure Laterality Date    EAR SURGERY Left        Family History   Problem Relation Age of Onset    Heart Failure Father         died playing golf at 6       Social History     Tobacco Use    Smoking status: Former     Current packs/day: 0.00     Types:  Cigarettes     Quit date: 06/13/2001     Years since quitting: 21.4     Passive exposure: Past    Smokeless tobacco: Never    Tobacco comments:     light smoking in social settings   Substance Use Topics    Alcohol use: Yes     Alcohol/week: 4.0 standard drinks of alcohol     Types: 4 Cans of beer per week       Allergies   Allergen Reactions    Beta Adrenergic Blockers Anaphylaxis and Other (See Comments)     Other Reaction(s): Other (See Comments)    Patient's heart "flatlined" and had to receive CPR.  Do not use beta blockers.     Patient's heart "flatlined" and had to receive CPR.  Do not use beta blockers.    Amoxicillin Other (See Comments)       Current Outpatient Medications   Medication Sig Dispense Refill    rosuvastatin (CRESTOR) 10 MG tablet Take 1 tablet by mouth nightly  sildenafil (VIAGRA) 100 MG tablet 1 tablet as needed Once a day       No current facility-administered medications for this visit.       Vitals:    BP 138/88   Pulse 56   Ht 1.753 m (5\' 9" )   Wt 83 kg (183 lb)   BMI 27.02 kg/m     Physical Exam:  Physical Exam  Vitals reviewed.   Constitutional:       General: He is not in acute distress.     Appearance: Normal appearance. He is not ill-appearing.   HENT:      Head: Normocephalic and atraumatic.      Right Ear: Tympanic membrane and ear canal normal. There is no impacted cerumen.      Left Ear: Tympanic membrane and ear canal normal. There is no impacted cerumen.      Nose: No congestion or rhinorrhea.      Mouth/Throat:      Mouth: Mucous membranes are moist.      Pharynx: No oropharyngeal exudate or posterior oropharyngeal erythema.   Eyes:      General: No scleral icterus.        Right eye: No discharge.         Left eye: No discharge.      Extraocular Movements: Extraocular movements intact.      Conjunctiva/sclera: Conjunctivae normal.      Pupils: Pupils are equal, round, and reactive to light.   Cardiovascular:      Rate and Rhythm: Normal rate and regular rhythm.       Pulses: Normal pulses.      Heart sounds: No murmur heard.     No friction rub. No gallop.   Pulmonary:      Effort: Pulmonary effort is normal. No respiratory distress.      Breath sounds: No stridor. No wheezing, rhonchi or rales.   Chest:      Chest wall: No tenderness.   Abdominal:      General: Abdomen is flat. Bowel sounds are normal. There is no distension.      Palpations: Abdomen is soft. There is no mass.      Tenderness: There is no abdominal tenderness. There is no right CVA tenderness or left CVA tenderness.   Musculoskeletal:         General: No tenderness. Normal range of motion.      Cervical back: Normal range of motion and neck supple. No rigidity or tenderness.      Right lower leg: No edema.      Left lower leg: No edema.   Lymphadenopathy:      Cervical: No cervical adenopathy.   Skin:     General: Skin is warm and dry.      Capillary Refill: Capillary refill takes less than 2 seconds.      Findings: No lesion or rash.   Neurological:      General: No focal deficit present.      Mental Status: He is alert and oriented to person, place, and time. Mental status is at baseline.      Sensory: No sensory deficit.      Motor: No weakness.      Coordination: Coordination normal.      Gait: Gait normal.   Psychiatric:         Mood and Affect: Mood normal.         Behavior: Behavior normal.  Thought Content: Thought content normal.         Judgment: Judgment normal.         Assessment/Plan:     ICD-10-CM    1. Dyslipidemia  E78.5 Lipid Panel      2. Erectile dysfunction due to arterial insufficiency  N52.01       3. Encounter for medical examination to establish care  Z00.00       4. Screening for colon cancer  Z12.11 [facility]      5. Annual physical exam  Z00.00 CBC with Auto Differential     Comprehensive Metabolic Panel     Lipid Panel     TSH     PSA Screening      6. Screening for heart disease  Z13.6 Lipid Panel      7. Screening for prostate cancer   Z12.5 PSA Screening           Problem List          Circulatory    Erectile dysfunction due to arterial insufficiency     Continue sildenafil            Other    Dyslipidemia - Primary      Continue crestor. F/u lipids. Encouraged to reduce red meat, fried foods, fast foods, butter, mayonnaise and dairy products; increase daily exercise and limit intake of egg yolks to < 7 egg yolks weekly.            Relevant Orders    Lipid Panel        Jaquita Folds, MD

## 2023-02-03 ENCOUNTER — Encounter

## 2023-02-03 ENCOUNTER — Encounter
Admit: 2023-02-03 | Discharge: 2023-02-03 | Payer: PRIVATE HEALTH INSURANCE | Attending: Family Medicine | Primary: Family Medicine

## 2023-02-03 DIAGNOSIS — Z0000 Encounter for general adult medical examination without abnormal findings: Secondary | ICD-10-CM

## 2023-02-03 LAB — CBC WITH AUTO DIFFERENTIAL
Basophils %: 1 % (ref 0.0–2.0)
Basophils Absolute: 0 10*3/uL (ref 0.0–0.2)
Eosinophils %: 4 % (ref 0.5–7.8)
Eosinophils Absolute: 0.2 10*3/uL (ref 0.0–0.8)
Hematocrit: 40.6 % — ABNORMAL LOW (ref 41.1–50.3)
Hemoglobin: 13.8 g/dL (ref 13.6–17.2)
Immature Granulocytes %: 0 % (ref 0.0–5.0)
Immature Granulocytes Absolute: 0 10*3/uL (ref 0.0–0.5)
Lymphocytes %: 30 % (ref 13–44)
Lymphocytes Absolute: 1.9 10*3/uL (ref 0.5–4.6)
MCH: 32.5 pg (ref 26.1–32.9)
MCHC: 34 g/dL (ref 31.4–35.0)
MCV: 95.8 fL (ref 82–102)
MPV: 10.5 fL (ref 9.4–12.3)
Monocytes %: 11 % (ref 4.0–12.0)
Monocytes Absolute: 0.7 10*3/uL (ref 0.1–1.3)
Neutrophils %: 54 % (ref 43–78)
Neutrophils Absolute: 3.4 10*3/uL (ref 1.7–8.2)
Platelets: 245 10*3/uL (ref 150–450)
RBC: 4.24 M/uL (ref 4.23–5.6)
RDW: 13.7 % (ref 11.9–14.6)
WBC: 6.2 10*3/uL (ref 4.3–11.1)
nRBC: 0 10*3/uL (ref 0.0–0.2)

## 2023-02-03 LAB — PSA SCREENING: PSA: 0.3 ng/mL (ref 0.0–4.0)

## 2023-02-03 LAB — COMPREHENSIVE METABOLIC PANEL
ALT: 19 U/L (ref 8–55)
AST: 20 U/L (ref 15–37)
Albumin/Globulin Ratio: 1.4 (ref 1.0–1.9)
Albumin: 4.3 g/dL (ref 3.2–4.6)
Alk Phosphatase: 54 U/L (ref 40–129)
Anion Gap: 10 mmol/L (ref 9–18)
BUN: 13 mg/dL (ref 8–23)
CO2: 27 mmol/L (ref 20–28)
Calcium: 9.6 mg/dL (ref 8.8–10.2)
Chloride: 104 mmol/L (ref 98–107)
Creatinine: 0.97 mg/dL (ref 0.80–1.30)
Est, Glom Filt Rate: 89 mL/min/{1.73_m2} (ref >60)
Globulin: 3.1 g/dL (ref 2.3–3.5)
Glucose: 103 mg/dL — ABNORMAL HIGH (ref 70–99)
Potassium: 4.5 mmol/L (ref 3.5–5.1)
Sodium: 141 mmol/L (ref 136–145)
Total Bilirubin: 0.5 mg/dL (ref 0.0–1.2)
Total Protein: 7.4 g/dL (ref 6.3–8.2)

## 2023-02-03 LAB — LIPID PANEL
Chol/HDL Ratio: 2.1 (ref 0.0–5.0)
Cholesterol, Total: 148 mg/dL (ref 0–200)
HDL: 70 mg/dL — ABNORMAL HIGH (ref 40–60)
LDL Cholesterol: 64 mg/dL (ref 0–100)
Triglycerides: 70 mg/dL (ref 0–150)
VLDL Cholesterol Calculated: 14 mg/dL (ref 6–23)

## 2023-02-03 LAB — TSH: TSH, 3rd Generation: 2.38 u[IU]/mL (ref 0.270–4.200)

## 2023-02-03 NOTE — Assessment & Plan Note (Signed)
Encouraged 5 servings of fruits and veggies daily. Instructed to drink approx 2 L of fluid daily, mostly water. Recommended 30 sustained minutes of aerobic exercise daily (e.g. cycling, rowing, jogging). Limit high calorie processed foods, red meat, egg yolks, dairy products, butter, mayo, fried and fast foods. Recommended influenza vaccination annually.

## 2023-02-03 NOTE — Progress Notes (Signed)
[name]  _______________________________________  Cathlean Marseilles, MD                 [address]        Llana Aliment. Shrouder, MD                     [place], [place] [address]                                                                                    Phone: [phone]                                                                                    Fax: [phone]    Stanley Mullins is a 62 y.o. male who is seen for evaluation of   Chief Complaint   Patient presents with    Annual Exam     Fasting for labs today, he will need to lay down for labs       HPI:   Stanley Mullins is seen today for CPE.         Review of Systems:  Review of Systems   Constitutional:  Negative for activity change, appetite change, chills, diaphoresis, fatigue, fever and unexpected weight change.   HENT:  Negative for congestion, ear discharge, ear pain, facial swelling, hearing loss, mouth sores, nosebleeds, postnasal drip, rhinorrhea, sinus pressure, sinus pain, sore throat and tinnitus.    Eyes:  Negative for pain, discharge, redness, itching and visual disturbance.   Respiratory:  Negative for cough, chest tightness, shortness of breath, wheezing and stridor.    Cardiovascular:  Negative for chest pain, palpitations and leg swelling.   Gastrointestinal:  Negative for abdominal distention, abdominal pain, blood in stool, constipation, diarrhea, nausea and vomiting.   Endocrine: Negative for cold intolerance, heat intolerance, polydipsia, polyphagia and polyuria.   Genitourinary:  Negative for decreased urine volume, difficulty urinating, dysuria, flank pain, frequency, hematuria and urgency.   Musculoskeletal:  Negative for arthralgias, back pain, gait problem, joint swelling, myalgias and neck pain.   Skin:  Negative for color change, pallor, rash and wound.   Neurological:  Negative for dizziness, tremors, seizures, syncope, facial asymmetry, speech difficulty, weakness, light-headedness, numbness and headaches.    Psychiatric/Behavioral:  Negative for agitation, behavioral problems, confusion, hallucinations, self-injury, sleep disturbance and suicidal ideas. The patient is not nervous/anxious.         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       Past Surgical History:   Procedure Laterality Date    EAR SURGERY Left        Family History   Problem Relation Age of Onset    Heart Failure Father  died playing golf at 78       Social History     Tobacco Use    Smoking status: Former     Current packs/day: 0.00     Types: Cigarettes     Quit date: 06/13/2001     Years since quitting: 21.6     Passive exposure: Past    Smokeless tobacco: Never    Tobacco comments:     light smoking in social settings   Substance Use Topics    Alcohol use: Yes     Alcohol/week: 4.0 standard drinks of alcohol     Types: 4 Cans of beer per week       Allergies   Allergen Reactions    Beta Adrenergic Blockers Anaphylaxis and Other (See Comments)     Other Reaction(s): Other (See Comments)    Patient's heart "flatlined" and had to receive CPR.  Do not use beta blockers.     Patient's heart "flatlined" and had to receive CPR.  Do not use beta blockers.    Amoxicillin Other (See Comments)       Current Outpatient Medications   Medication Sig Dispense Refill    rosuvastatin (CRESTOR) 10 MG tablet Take 1 tablet by mouth nightly      sildenafil (VIAGRA) 100 MG tablet 1 tablet as needed Once a day       No current facility-administered medications for this visit.       Vitals:    BP 138/80 (Site: Left Upper Arm, Position: Sitting)   Ht 1.753 m (5\' 9" )   Wt 84.4 kg (186 lb)   BMI 27.47 kg/m     Physical Exam:  Physical Exam  Vitals reviewed.   Constitutional:       General: He is not in acute distress.     Appearance: Normal appearance. He is not ill-appearing.   HENT:      Head: Normocephalic and atraumatic.      Right Ear: Tympanic membrane and ear canal normal. There is no  impacted cerumen.      Left Ear: Tympanic membrane and ear canal normal. There is no impacted cerumen.      Nose: No congestion or rhinorrhea.      Mouth/Throat:      Mouth: Mucous membranes are moist.      Pharynx: No oropharyngeal exudate or posterior oropharyngeal erythema.   Eyes:      General: No scleral icterus.        Right eye: No discharge.         Left eye: No discharge.      Extraocular Movements: Extraocular movements intact.      Conjunctiva/sclera: Conjunctivae normal.      Pupils: Pupils are equal, round, and reactive to light.   Cardiovascular:      Rate and Rhythm: Normal rate and regular rhythm.      Pulses: Normal pulses.      Heart sounds: No murmur heard.     No friction rub. No gallop.   Pulmonary:      Effort: Pulmonary effort is normal. No respiratory distress.      Breath sounds: No stridor. No wheezing, rhonchi or rales.   Chest:      Chest wall: No tenderness.   Abdominal:      General: Abdomen is flat. Bowel sounds are normal. There is no distension.      Palpations: Abdomen is soft. There is no mass.      Tenderness: There  is no abdominal tenderness. There is no right CVA tenderness or left CVA tenderness.   Musculoskeletal:         General: No tenderness. Normal range of motion.      Cervical back: Normal range of motion and neck supple. No rigidity or tenderness.      Right lower leg: No edema.      Left lower leg: No edema.   Lymphadenopathy:      Cervical: No cervical adenopathy.   Skin:     General: Skin is warm and dry.      Capillary Refill: Capillary refill takes less than 2 seconds.      Findings: No lesion or rash.   Neurological:      General: No focal deficit present.      Mental Status: He is alert and oriented to person, place, and time. Mental status is at baseline.      Sensory: No sensory deficit.      Motor: No weakness.      Coordination: Coordination normal.      Gait: Gait normal.   Psychiatric:         Mood and Affect: Mood normal.         Behavior: Behavior normal.          Thought Content: Thought content normal.         Judgment: Judgment normal.         Assessment/Plan:     ICD-10-CM    1. Annual physical exam  Z00.00            Problem List             Diagnosed       Other    Annual physical exam - Primary       Encouraged 5 servings of fruits and veggies daily. Instructed to drink approx 2 L of fluid daily, mostly water. Recommended 30 sustained minutes of aerobic exercise daily (e.g. cycling, rowing, jogging). Limit high calorie processed foods, red meat, egg yolks, dairy products, butter, mayo, fried and fast foods. Recommended influenza vaccination annually.               Jaquita Folds, MD

## 2023-02-15 NOTE — Telephone Encounter (Signed)
Upon chart review for appointment tomorrow noticed that patient has been referred for screening colonoscopy. If patient  is not having symptoms and does not have any concerns, this can be a direct schedule colonoscopy without an office visit. No answer. LVM with information, office # to return call asap.

## 2023-02-16 ENCOUNTER — Encounter

## 2023-02-16 ENCOUNTER — Encounter: Payer: PRIVATE HEALTH INSURANCE | Primary: Family Medicine

## 2023-02-16 MED ORDER — BISACODYL 5 MG PO TBEC
5 | ORAL_TABLET | Freq: Once | ORAL | 0 refills | Status: AC
Start: 2023-02-16 — End: 2023-02-16

## 2023-02-16 MED ORDER — GOLYTELY 236 G PO SOLR
236 | Freq: Once | ORAL | 0 refills | Status: AC
Start: 2023-02-16 — End: 2023-02-16

## 2023-02-16 NOTE — Progress Notes (Unsigned)
Referral  Diagnosis   Z12.11 (ICD-10-CM) - Screening for colon cancer     See note from 02/15/2023

## 2023-02-25 NOTE — Progress Notes (Signed)
 Patient verified name, DOB, and procedure.    Type: 1A; abbreviated assessment per anesthesia guidelines.    Labs per anesthesia: None.  EKG: Not needed, per anesthesia guidelines.     Instructed pt that they will be notified the day before their procedure

## 2023-03-03 NOTE — Progress Notes (Signed)
 Procedure confirmed with patient. Aware of 0715 arrival time, where to arrive and driver policy. Instructed to drink 32 oz of H2O 2 hours prior to leaving. No questions at this time.

## 2023-03-04 ENCOUNTER — Inpatient Hospital Stay
Admit: 2023-03-04 | Discharge: 2023-03-04 | Disposition: A | Discharge status: Home / Self Care | Payer: PRIVATE HEALTH INSURANCE | Source: Ambulatory Visit

## 2023-03-04 DIAGNOSIS — Z1211 Encounter for screening for malignant neoplasm of colon: Secondary | ICD-10-CM

## 2023-03-04 MED ORDER — NORMAL SALINE FLUSH 0.9 % IV SOLN
0.9 | Freq: Two times a day (BID) | INTRAVENOUS | Status: DC
Start: 2023-03-04 — End: 2023-03-04

## 2023-03-04 MED ORDER — NORMAL SALINE FLUSH 0.9 % IV SOLN
0.9 | INTRAVENOUS | Status: DC | PRN
Start: 2023-03-04 — End: 2023-03-04

## 2023-03-04 MED ORDER — GLYCOPYRROLATE 0.4 MG/2ML IJ SOLN
0.4 | Freq: Once | INTRAMUSCULAR | Status: DC | PRN
Start: 2023-03-04 — End: 2023-03-04
  Administered 2023-03-04: 14:00:00 .2 via INTRAVENOUS

## 2023-03-04 MED ORDER — SODIUM CHLORIDE FLUSH 0.9 % IV SOLN
0.9 | Freq: Once | INTRAVENOUS | Status: DC | PRN
Start: 2023-03-04 — End: 2023-03-04
  Administered 2023-03-04: 14:00:00 30 via INTRAVENOUS
  Administered 2023-03-04: 15:00:00 20 via INTRAVENOUS

## 2023-03-04 MED ORDER — LACTATED RINGERS IV SOLN
INTRAVENOUS | Status: DC
Start: 2023-03-04 — End: 2023-03-04

## 2023-03-04 MED ORDER — LIDOCAINE HCL (PF) 2 % IJ SOLN
2 | Freq: Once | INTRAMUSCULAR | Status: DC | PRN
Start: 2023-03-04 — End: 2023-03-04
  Administered 2023-03-04: 14:00:00 50 via INTRAVENOUS

## 2023-03-04 MED ORDER — SODIUM CHLORIDE 0.9 % IV SOLN
0.9 | INTRAVENOUS | Status: DC | PRN
Start: 2023-03-04 — End: 2023-03-04

## 2023-03-04 MED ORDER — LIDOCAINE HCL 1 % IJ SOLN
1 | Freq: Once | INTRAMUSCULAR | Status: DC | PRN
Start: 2023-03-04 — End: 2023-03-04

## 2023-03-04 MED ORDER — PROPOFOL 200 MG/20ML IV EMUL
200 | INTRAVENOUS | Status: DC | PRN
Start: 2023-03-04 — End: 2023-03-04
  Administered 2023-03-04: 14:00:00 200 via INTRAVENOUS
  Administered 2023-03-04: 14:00:00 30 via INTRAVENOUS
  Administered 2023-03-04 (×2): 50 via INTRAVENOUS

## 2023-03-04 NOTE — Anesthesia Pre-Procedure Evaluation (Signed)
 Department of Anesthesiology  Preprocedure Note       Name:  Stanley Mullins   Age:  62 y.o.  DOB:  20-Sep-1960                                          MRN:  [id number]         Date:  03/04/2023      Surgeon: Moishe Spice):  Mila Homer, DO    Procedure: Demetrius Charity

## 2023-03-04 NOTE — Anesthesia Post-Procedure Evaluation (Signed)
 Department of Anesthesiology  Postprocedure Note    Patient: Stanley Mullins  MRN: [id number]  Birthdate: [birth year]  Date of evaluation: 03/04/2023    Procedure Summary       Date: 03/04/23 Room / Location: [facility] / [facility]    Anesthesia Start: [phone]

## 2023-03-04 NOTE — Discharge Instructions (Signed)
 Gastrointestinal Colonoscopy/Flexible Sigmoidoscopy - Lower Exam Discharge Instructions    Call Dr. Illene Labrador office for any problems or questions.    Contact the doctor's office for follow up appointment as directed.    Medication may cause drowsiness for s

## 2023-03-04 NOTE — H&P (Signed)
 Stanley Mullins is 62 y.o. y/o male here for screening colonoscopy.    No immediate (parents/siblings) FH of colon cancer, no acute symptoms.     Past Medical History:   Diagnosis Date    Bradycardia     50s, per pt    Hearing loss Spring 2019    Surgery to r

## 2023-03-15 ENCOUNTER — Encounter: Admit: 2023-03-15 | Payer: PRIVATE HEALTH INSURANCE | Admitting: Physician Assistant | Primary: Family Medicine

## 2023-03-15 VITALS — BP 150/83 | HR 58 | Resp 18 | Ht 69.5 in | Wt 188.0 lb

## 2023-03-15 DIAGNOSIS — K635 Polyp of colon: Principal | ICD-10-CM

## 2023-03-15 NOTE — Patient Instructions (Signed)
 For reflux:   Eat smaller and more frequent meals. Avoid lying down for 3 hours after eating. Elevate the head of the bed by 6 inches. Avoid wearing tight-fitting clothing. Stop smoking and avoid alcohol. Avoid NSAID medications (Aspirin, Excedrin, Aleve,

## 2023-03-15 NOTE — Telephone Encounter (Signed)
 As requested by provider 10 year colonoscopy placed.

## 2023-03-15 NOTE — Telephone Encounter (Signed)
-----   Message from Remo Lipps, [place] sent at 03/15/2023  8:54 AM EST -----  Please place colo recall 10 years

## 2023-03-15 NOTE — Progress Notes (Signed)
 Stanley Mullins (DOB:  [birth year]) is a 62 y.o. male new patient referred to our office for evaluation of the following chief complaint(s):  Follow-up and Procedure (No other concerns.)        Assessment & Plan   ASSESSMENT/PLAN:  1. Hyperplastic polyp of desc

## 2023-09-29 ENCOUNTER — Ambulatory Visit
Admit: 2023-09-29 | Discharge: 2023-09-29 | Payer: PRIVATE HEALTH INSURANCE | Attending: Otolaryngology | Primary: Family Medicine

## 2023-09-29 VITALS — HR 67 | Resp 17 | Ht 69.5 in | Wt 184.0 lb

## 2023-09-29 DIAGNOSIS — H6993 Unspecified Eustachian tube disorder, bilateral: Secondary | ICD-10-CM

## 2023-09-29 NOTE — Progress Notes (Signed)
 HPI:    Stanley Mullins is a 63 y.o. male seen Established   Chief Complaint   Patient presents with    Follow-up     Patient  presents today for annual ear check with history of cholesteatoma .         History of Present Illness  63 year old male presents for annual follow-up.    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 One year ago, he had progressive right-sided hearing loss and underwent a right-sided myringotomy on 08/26/2022 for middle ear effusion post-URI sinusitis. Follow-up on 09/30/2022 showed significant improvement with normal hearing. Eustachian tube dysfunction treatment was advised.    Currently, no new hearing changes. Right ear remains clear and unobstructed. Previous specialist recommended annual exams and ear cleanings.    PAST SURGICAL HISTORY:  - Left-sided tympanomastoidectomy, 2019  - Right-sided myringotomy, 08/26/2022        Past Medical History, Past Surgical History, Family history, Social History, and Medications were all reviewed with the patient today and updated as necessary.     Allergies   Allergen Reactions    Beta Adrenergic Blockers Anaphylaxis and Other (See Comments)     Other Reaction(s): Other (See Comments)    Patient's heart flatlined and had to receive CPR.  Do not use beta blockers.         Amoxicillin Other (See Comments)       Patient Active Problem List   Diagnosis    Dyslipidemia    Erectile dysfunction due to arterial insufficiency    Adenomatous polyp of descending colon       Current Outpatient Medications   Medication Sig    Omega-3 Fatty Acids (FISH OIL CONCENTRATE PO) Take by mouth Daily with lunch    Multiple Vitamins-Minerals (MULTIVITAMIN MEN 50+ PO) Take by mouth daily    Coenzyme Q10 (COQ-10 PO) Take 400 mg by mouth daily    rosuvastatin (CRESTOR) 10 MG tablet Take 1 tablet by mouth nightly    sildenafil (VIAGRA) 100 MG tablet 1 tablet as needed Once a day (Patient not  taking: Reported on 09/29/2023)     No current facility-administered medications for this visit.       Past Medical History:   Diagnosis Date    Bradycardia     50s, per pt    Hearing loss Spring 2019    Surgery to remove cholesteatoma    Hyperlipidemia     Vagal reaction     during blood donation and blood draws       Past Surgical History:   Procedure Laterality Date    COLONOSCOPY N/A 03/04/2023    COLONOSCOPY POLYPECTOMY performed by Laverna Pott, DO at [facility] ENDOSCOPY    EAR SURGERY Left     tumor in inner ear removed    MOUTH SURGERY         Social History     Tobacco Use    Smoking status: Former     Current packs/day: 0.00     Types: Cigarettes     Quit date: 06/13/2001     Years since quitting: 22.3     Passive exposure: Past    Smokeless tobacco: Never    Tobacco comments:     light smoking in social settings   Substance Use Topics    Alcohol use: Yes     Alcohol/week: 4.0 standard drinks of alcohol     Types: 4 Cans of beer per  week       Family History   Problem Relation Age of Onset    Heart Failure Father         died playing golf at 41        ROS:    Review of Systems   Constitutional:  Negative for chills and fever.   HENT:  Negative for congestion, facial swelling, hearing loss, nosebleeds, sinus pressure, sinus pain and sneezing.    Eyes:  Negative for pain, discharge and itching.   Respiratory:  Negative for apnea, cough, choking, shortness of breath, wheezing and stridor.    Cardiovascular:  Negative for chest pain.   Gastrointestinal:  Negative for constipation, diarrhea and nausea.   Endocrine: Negative for polyuria.   Genitourinary:  Negative for difficulty urinating and flank pain.   Musculoskeletal:  Negative for arthralgias, myalgias and neck stiffness.   Skin:  Negative for rash and wound.   Allergic/Immunologic: Negative for environmental allergies.   Neurological:  Negative for dizziness, facial asymmetry and headaches.   Hematological:  Negative for adenopathy. Does not bruise/bleed  easily.   Psychiatric/Behavioral:  Negative for agitation, behavioral problems and confusion.            PHYSICAL EXAM:    Pulse 67   Resp 17   Ht 1.765 m (5' 9.5)   Wt 83.5 kg (184 lb)   SpO2 96%   BMI 26.78 kg/m     Physical Exam  Vitals and nursing note reviewed.   Constitutional:       Appearance: Normal appearance.   HENT:      Head: Normocephalic and atraumatic.      Right Ear: Tympanic membrane, ear canal and external ear normal. No middle ear effusion. There is no impacted cerumen. Tympanic membrane is not perforated, erythematous or retracted.      Left Ear: Tympanic membrane, ear canal and external ear normal.  No middle ear effusion. There is no impacted cerumen. Tympanic membrane is not perforated, erythematous or retracted.      Ears:      Comments: Binocular microscopy revealed:    Bilateral EACs patent without edema erythema or lesions  Bilateral TMs intact with no perforations retractions or middle ear effusions       Nose: Nose normal. No congestion or rhinorrhea.      Mouth/Throat:      Mouth: Mucous membranes are moist.      Pharynx: Oropharynx is clear. No oropharyngeal exudate or posterior oropharyngeal erythema.   Eyes:      General: No scleral icterus.  Cardiovascular:      Rate and Rhythm: Normal rate and regular rhythm.      Heart sounds: Normal heart sounds, S1 normal and S2 normal.   Pulmonary:      Effort: Pulmonary effort is normal.   Musculoskeletal:      Cervical back: Normal range of motion and neck supple. No rigidity.   Lymphadenopathy:      Cervical: No cervical adenopathy.   Skin:     General: Skin is warm and dry.   Neurological:      Mental Status: He is alert and oriented to person, place, and time.   Psychiatric:         Mood and Affect: Mood normal.         Behavior: Behavior normal.              ASSESSMENT and PLAN:        ICD-10-CM  1. ETD (Eustachian tube dysfunction), bilateral  H69.93 EAR MICROSCOPY EXAMINATION      2. History of cholesteatoma  Z86.69       3.  Conductive hearing loss of left ear with unrestricted hearing of right ear  H90.12           Assessment & Plan  1. Eustachian tube dysfunction  - Significant improvement in right ear post-myringotomy for middle ear effusion 1 year ago  - Reviewed pathophysiology and treatment options for ETD  - Initiate corticosteroid nasal spray (Flonase) if symptoms arise  - Further evaluation if symptoms persist or worsen    2. History of left-sided cholesteatoma  - History of left-sided cholesteatoma treated with tympanomastoidectomy in 2019  - No ear cleaning needed today  - Annual exams recommended  - Call if future ear concerns    3. Left-sided conductive hearing loss  - Chronic severe conductive hearing loss in left ear post-tympanomastoidectomy  - Left ear exam stable  - Annual follow-ups advised        Jasper Messenger, DO  09/29/2023      The patient (or guardian, if applicable) and other individuals in attendance with the patient were advised that Artificial Intelligence will be utilized during this visit to record, process the conversation to generate a clinical note, and support improvement of the AI technology. The patient (or guardian, if applicable) and other individuals in attendance at the appointment consented to the use of AI, including the recording.

## 2024-01-31 ENCOUNTER — Encounter

## 2024-01-31 ENCOUNTER — Other Ambulatory Visit: Admit: 2024-01-31 | Discharge: 2024-01-31 | Payer: PRIVATE HEALTH INSURANCE | Primary: Family Medicine

## 2024-01-31 DIAGNOSIS — Z0000 Encounter for general adult medical examination without abnormal findings: Principal | ICD-10-CM

## 2024-01-31 LAB — CBC WITH AUTO DIFFERENTIAL
Basophils %: 0.6 % (ref 0.0–2.0)
Basophils Absolute: 0.05 K/UL (ref 0.00–0.20)
Eosinophils %: 5.3 % (ref 0.5–7.8)
Eosinophils Absolute: 0.43 K/UL (ref 0.00–0.80)
Hematocrit: 41.9 % (ref 41.1–50.3)
Hemoglobin: 14.3 g/dL (ref 13.6–17.2)
Immature Granulocytes %: 0.1 % (ref 0.0–5.0)
Immature Granulocytes Absolute: 0.01 K/UL (ref 0.0–0.5)
Lymphocytes %: 37.3 % (ref 13.0–44.0)
Lymphocytes Absolute: 3.01 K/UL (ref 0.50–4.60)
MCH: 32.4 pg (ref 26.1–32.9)
MCHC: 34.1 g/dL (ref 31.4–35.0)
MCV: 94.8 FL (ref 82–102)
MPV: 10.4 FL (ref 9.4–12.3)
Monocytes %: 11.3 % (ref 4.0–12.0)
Monocytes Absolute: 0.91 K/UL (ref 0.10–1.30)
Neutrophils %: 45.4 % (ref 43.0–78.0)
Neutrophils Absolute: 3.65 K/UL (ref 1.70–8.20)
Platelets: 232 K/uL (ref 150–450)
RBC: 4.42 M/uL (ref 4.23–5.6)
RDW: 13.6 % (ref 11.9–14.6)
WBC: 8.1 K/uL (ref 4.3–11.1)
nRBC: 0 K/uL (ref 0.0–0.2)

## 2024-01-31 LAB — COMPREHENSIVE METABOLIC PANEL
ALT: 21 U/L (ref 8–55)
AST: 20 U/L (ref 15–37)
Albumin/Globulin Ratio: 1.3 (ref 1.0–1.9)
Albumin: 4.2 g/dL (ref 3.2–4.6)
Alk Phosphatase: 53 U/L (ref 40–129)
Anion Gap: 9 mmol/L (ref 7–16)
BUN: 14 mg/dL (ref 8–23)
CO2: 27 mmol/L (ref 20–29)
Calcium: 9.7 mg/dL (ref 8.8–10.2)
Chloride: 104 mmol/L (ref 98–107)
Creatinine: 1.07 mg/dL (ref 0.80–1.30)
Est, Glom Filt Rate: 78 ml/min/1.73m2 (ref >60)
Globulin: 3.1 g/dL (ref 2.3–3.5)
Glucose: 104 mg/dL — ABNORMAL HIGH (ref 70–99)
Potassium: 4.9 mmol/L (ref 3.5–5.1)
Sodium: 140 mmol/L (ref 136–145)
Total Bilirubin: 0.5 mg/dL (ref 0.0–1.2)
Total Protein: 7.3 g/dL (ref 6.3–8.2)

## 2024-01-31 LAB — LIPID PANEL
Chol/HDL Ratio: 1.9 (ref 0.0–5.0)
Cholesterol, Total: 142 mg/dL (ref 0–200)
HDL: 75 mg/dL — ABNORMAL HIGH (ref 40–60)
LDL Cholesterol: 56 mg/dL (ref 0–100)
Triglycerides: 51 mg/dL (ref 0–150)
VLDL Cholesterol Calculated: 10 mg/dL (ref 6–23)

## 2024-01-31 LAB — PSA, TOTAL AND FREE
PSA, Free Pct: 27.9 %
PSA, Free: 0.3 ng/mL
PSA: 1.2 ng/mL (ref 0.0–4.0)

## 2024-01-31 LAB — TSH: TSH, 3rd Generation: 1.54 u[IU]/mL (ref 0.270–4.200)

## 2024-02-04 ENCOUNTER — Ambulatory Visit
Admit: 2024-02-04 | Discharge: 2024-02-04 | Payer: PRIVATE HEALTH INSURANCE | Attending: Family Medicine | Primary: Family Medicine

## 2024-02-04 MED ORDER — ROSUVASTATIN CALCIUM 10 MG PO TABS
10 | ORAL_TABLET | Freq: Every evening | ORAL | 3 refills | 90.00000 days | Status: AC
Start: 2024-02-04 — End: ?

## 2024-02-04 NOTE — Assessment & Plan Note (Signed)
"   Encouraged 5 servings of fruits and veggies daily. Instructed to drink approx 2 L of fluid daily, mostly water. Recommended 30 sustained minutes of aerobic exercise daily (e.g. cycling, rowing, jogging). Limit high calorie processed foods, red meat, dairy products, butter, mayo, fried and fast foods. Recommended influenza vaccination annually.    "

## 2024-02-04 NOTE — Progress Notes (Signed)
 "[name]  _______________________________________  Prentice [name], MD                 [address]        Lynwood CROME. Shrouder, MD                     [place], [place] [address]                                                                                    Phone: [phone]                                                                                    Fax: [phone]    Miley Lindon is a 63 y.o. male who is seen for evaluation of   Chief Complaint   Patient presents with    Discuss Labs    Annual Exam       HPI:   HPI  Lab Results   Component Value Date    PSA 1.2 01/31/2024     Lab Results   Component Value Date    WBC 8.1 01/31/2024    HGB 14.3 01/31/2024    HCT 41.9 01/31/2024    MCV 94.8 01/31/2024    PLT 232 01/31/2024     Lab Results   Component Value Date    NA 140 01/31/2024    K 4.9 01/31/2024    CL 104 01/31/2024    CO2 27 01/31/2024    BUN 14 01/31/2024    CREATININE 1.07 01/31/2024    GLUCOSE 104 (H) 01/31/2024    CALCIUM  9.7 01/31/2024    BILITOT 0.5 01/31/2024    ALKPHOS 53 01/31/2024    AST 20 01/31/2024    ALT 21 01/31/2024    LABGLOM 78 01/31/2024    GLOB 3.1 01/31/2024       Lab Results   Component Value Date    CHOL 142 01/31/2024    TRIG 51 01/31/2024    HDL 75 (H) 01/31/2024    LDL 56 01/31/2024    VLDL 10 01/31/2024    CHOLHDLRATIO 1.9 01/31/2024     Lab Results   Component Value Date    TSH 1.540 01/31/2024       Review of Systems:  Review of Systems   Constitutional:  Negative for activity change, appetite change, chills, diaphoresis, fatigue, fever and unexpected weight change.   HENT:  Negative for congestion, ear discharge, ear pain, facial swelling, hearing loss, mouth sores, nosebleeds, postnasal drip, rhinorrhea, sinus pressure, sinus pain, sore throat and tinnitus.    Eyes:  Negative for pain, discharge, redness, itching and visual disturbance.   Respiratory:  Negative for cough, chest tightness, shortness of breath, wheezing and stridor.     Cardiovascular:  Negative for chest pain, palpitations and  leg swelling.   Gastrointestinal:  Negative for abdominal distention, abdominal pain, blood in stool, constipation, diarrhea, nausea and vomiting.   Endocrine: Negative for cold intolerance, heat intolerance, polydipsia, polyphagia and polyuria.   Genitourinary:  Negative for decreased urine volume, difficulty urinating, dysuria, flank pain, frequency, hematuria and urgency.   Musculoskeletal:  Negative for arthralgias, back pain, gait problem, joint swelling, myalgias and neck pain.   Skin:  Negative for color change, pallor, rash and wound.   Neurological:  Negative for dizziness, tremors, seizures, syncope, facial asymmetry, speech difficulty, weakness, light-headedness, numbness and headaches.   Psychiatric/Behavioral:  Negative for agitation, behavioral problems, confusion, hallucinations, self-injury, sleep disturbance and suicidal ideas. The patient is not nervous/anxious.         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    MOUTH SURGERY         Family History   Problem Relation Age of Onset    Heart Failure Father         died playing golf at 92       Social History     Tobacco Use    Smoking status: Former     Current packs/day: 0.00     Types: Cigarettes     Quit date: 06/13/2001     Years since quitting: 22.6     Passive exposure: Past    Smokeless tobacco: Never    Tobacco comments:     light smoking in social settings   Substance Use Topics    Alcohol use: Yes     Alcohol/week: 4.0 standard drinks of alcohol     Types: 4 Cans of beer per week        Allergies   Allergen Reactions    Beta Adrenergic Blockers Anaphylaxis and Other (See Comments)     Other Reaction(s): Other (See Comments)    Patient's heart flatlined and had to receive CPR.  Do not use beta blockers.         Amoxicillin Other (See Comments)       Current Outpatient Medications   Medication Sig Dispense Refill    rosuvastatin  (CRESTOR ) 10 MG tablet Take 1 tablet by mouth nightly 90 tablet 3    Omega-3 Fatty Acids (FISH OIL CONCENTRATE PO) Take by mouth Daily with lunch      Multiple Vitamins-Minerals (MULTIVITAMIN MEN 50+ PO) Take by mouth daily      Coenzyme Q10 (COQ-10 PO) Take 400 mg by mouth daily       No current facility-administered medications for this visit.       Vitals:    BP 124/84 (BP Site: Left Upper Arm, Patient Position: Sitting)   Ht 1.765 m (5' 9.5)   Wt 83.5 kg (184 lb)   BMI 26.78 kg/m     Physical Exam:  Physical Exam  Vitals reviewed.   Constitutional:       General: He is not in acute distress.  Appearance: Normal appearance. He is not ill-appearing.   HENT:      Head: Normocephalic and atraumatic.      Right Ear: Tympanic membrane and ear canal normal. There is no impacted cerumen.      Left Ear: Tympanic membrane and ear canal normal. There is no impacted cerumen.      Nose: No congestion or rhinorrhea.      Mouth/Throat:      Mouth: Mucous membranes are moist.      Pharynx: No oropharyngeal exudate or posterior oropharyngeal erythema.   Eyes:      General: No scleral icterus.        Right eye: No discharge.         Left eye: No discharge.      Extraocular Movements: Extraocular movements intact.      Conjunctiva/sclera: Conjunctivae normal.      Pupils: Pupils are equal, round, and reactive to light.   Cardiovascular:      Rate and Rhythm: Normal rate and regular rhythm.      Pulses: Normal pulses.      Heart sounds: No murmur heard.     No friction rub. No gallop.   Pulmonary:      Effort: Pulmonary effort is normal. No respiratory distress.      Breath  sounds: No stridor. No wheezing, rhonchi or rales.   Chest:      Chest wall: No tenderness.   Abdominal:      General: Abdomen is flat. Bowel sounds are normal. There is no distension.      Palpations: Abdomen is soft. There is no mass.      Tenderness: There is no abdominal tenderness. There is no right CVA tenderness or left CVA tenderness.   Musculoskeletal:         General: No tenderness. Normal range of motion.      Cervical back: Normal range of motion and neck supple. No rigidity or tenderness.      Right lower leg: No edema.      Left lower leg: No edema.   Lymphadenopathy:      Cervical: No cervical adenopathy.   Skin:     General: Skin is warm and dry.      Capillary Refill: Capillary refill takes less than 2 seconds.      Findings: No lesion or rash.   Neurological:      General: No focal deficit present.      Mental Status: He is alert and oriented to person, place, and time. Mental status is at baseline.      Sensory: No sensory deficit.      Motor: No weakness.      Coordination: Coordination normal.      Gait: Gait normal.   Psychiatric:         Mood and Affect: Mood normal.         Behavior: Behavior normal.         Thought Content: Thought content normal.         Judgment: Judgment normal.         Assessment/Plan:     ICD-10-CM    1. Annual physical exam  Z00.00       2. Dyslipidemia  E78.5 rosuvastatin  (CRESTOR ) 10 MG tablet     Lipid Panel      3. PSA elevation  R97.20 PSA, Total and Free      4. Hyperglycemia  R73.9 Hemoglobin A1C  Problem List           Diagnosed       Endocrine    Hyperglycemia     Relevant Orders    Hemoglobin A1C       Other    Dyslipidemia     Relevant Medications    rosuvastatin  (CRESTOR ) 10 MG tablet    Other Relevant Orders    Lipid Panel    Annual physical exam - Primary      Encouraged 5 servings of fruits and veggies daily. Instructed to drink approx 2 L of fluid daily, mostly water. Recommended 30 sustained minutes of aerobic exercise daily (e.g. cycling,  rowing, jogging). Limit high calorie processed foods, red meat, dairy products, butter, mayo, fried and fast foods. Recommended influenza vaccination annually.           PSA elevation     Relevant Orders    PSA, Total and Free        Lynwood LITTIE Golder III, MD          "

## 2024-04-12 ENCOUNTER — Ambulatory Visit
Admit: 2024-04-12 | Discharge: 2024-04-12 | Payer: PRIVATE HEALTH INSURANCE | Attending: Family Medicine | Primary: Family Medicine

## 2024-04-12 MED ORDER — HYDROXYZINE HCL 25 MG PO TABS
25 | ORAL_TABLET | Freq: Every evening | ORAL | 0 refills | 30.00000 days | Status: AC | PRN
Start: 2024-04-12 — End: ?

## 2024-04-12 NOTE — Assessment & Plan Note (Signed)
"  Offered bromfed. Pt declines as s/s are improving.   "

## 2024-04-12 NOTE — Assessment & Plan Note (Addendum)
"   Seems to have resolved. Characteristics suggest this was not shingles. Rather, could have been a viral exanthem. Trial hydroxyzine  if needed. Rtc if needed.   "

## 2024-04-12 NOTE — Progress Notes (Signed)
 "[facility]  _______________________________________  Prentice Stanley Culver, MD                 [address]        Stanley CROME. Shrouder, MD                     [place], [place] [address]                                                                                    Phone: [phone]                                                                                    Fax: [phone]    Stanley Mullins is a 63 y.o. male who is seen for evaluation of   Chief Complaint   Patient presents with    Skin Problem     Possible shingles on his back, appeared 5 days ago        HPI:   Skin Problem  This is a new problem. The current episode started in the past 7 days. The problem has been rapidly worsening since onset. The affected locations include the back. The rash is characterized by itchiness and pain. Associated with: started after developing viral illness. Associated symptoms include congestion, coughing and rhinorrhea. Pertinent negatives include no diarrhea, eye pain, fatigue, fever, shortness of breath, sore throat or vomiting.       Review of Systems:  Review of Systems   Constitutional:  Negative for activity change, appetite change, chills, diaphoresis, fatigue, fever and unexpected weight change.   HENT:  Positive for congestion and rhinorrhea. Negative for ear discharge, ear pain, facial swelling, hearing loss, mouth sores, nosebleeds, postnasal drip, sinus pressure, sinus pain, sore throat and tinnitus.    Eyes:  Negative for pain, discharge, redness, itching and visual disturbance.   Respiratory:  Positive for cough. Negative for chest tightness, shortness of breath, wheezing and stridor.    Cardiovascular:  Negative for chest pain, palpitations and leg swelling.   Gastrointestinal:  Negative for abdominal distention, abdominal pain, blood in stool, constipation, diarrhea, nausea and vomiting.   Endocrine: Negative for cold intolerance, heat intolerance, polydipsia, polyphagia and polyuria.    Genitourinary:  Negative for decreased urine volume, difficulty urinating, dysuria, flank pain, frequency, hematuria and urgency.   Musculoskeletal:  Positive for myalgias. Negative for arthralgias, back pain, gait problem, joint swelling and neck pain.   Skin:  Negative for color change, pallor, rash and wound.   Neurological:  Negative for dizziness, tremors, seizures, syncope, facial asymmetry, speech difficulty, weakness, light-headedness, numbness and headaches.   Psychiatric/Behavioral:  Negative for agitation, behavioral problems, confusion, hallucinations, self-injury, sleep disturbance and suicidal ideas. The patient is not nervous/anxious.         History:  Past Medical History:  Diagnosis Date    Bradycardia     50s, per pt    Hearing loss Spring 2019    Surgery to remove cholesteatoma    Hyperlipidemia     Vagal reaction     during blood donation and blood draws       Past Surgical History:   Procedure Laterality Date    COLONOSCOPY N/A 03/04/2023    COLONOSCOPY POLYPECTOMY performed by Stanley Ozell Motto, DO at [facility] ENDOSCOPY    EAR SURGERY Left     tumor in inner ear removed    MOUTH SURGERY         Family History   Problem Relation Age of Onset    Heart Failure Father         died playing golf at 95    Heart Disease Father 81        died age 8 playing golf - cardiac arrest       Social History     Tobacco Use    Smoking status: Former     Current packs/day: 0.00     Types: Cigarettes     Quit date: 06/13/2001     Years since quitting: 22.8     Passive exposure: Past    Smokeless tobacco: Never    Tobacco comments:     light smoking in social settings   Substance Use Topics    Alcohol use: Yes     Alcohol/week: 4.0 standard drinks of alcohol     Types: 4 Cans of beer per week       Allergies   Allergen Reactions    Beta Adrenergic Blockers Anaphylaxis and Other (See Comments)     Other Reaction(s): Other (See Comments)    Patient's heart flatlined and had to receive CPR.  Do not use beta blockers.          Amoxicillin Other (See Comments)       Current Outpatient Medications   Medication Sig Dispense Refill    hydrOXYzine  HCl (ATARAX ) 25 MG tablet Take 1 tablet by mouth nightly as needed for Itching 15 tablet 0    rosuvastatin  (CRESTOR ) 10 MG tablet Take 1 tablet by mouth nightly 90 tablet 3    Omega-3 Fatty Acids (FISH OIL CONCENTRATE PO) Take by mouth Daily with lunch      Multiple Vitamins-Minerals (MULTIVITAMIN MEN 50+ PO) Take by mouth daily      Coenzyme Q10 (COQ-10 PO) Take 400 mg by mouth daily       No current facility-administered medications for this visit.       Vitals:    BP 134/80 (BP Site: Left Upper Arm, Patient Position: Sitting)   Ht 1.765 m (5' 9.5)   Wt 83 kg (183 lb)   BMI 26.64 kg/m     Physical Exam:  Physical Exam  Vitals reviewed.   Constitutional:       General: He is not in acute distress.     Appearance: Normal appearance. He is not ill-appearing.   HENT:      Head: Normocephalic and atraumatic.      Right Ear: External ear normal.      Left Ear: External ear normal.      Nose: Nose normal.      Mouth/Throat:      Mouth: Mucous membranes are moist.      Pharynx: No oropharyngeal exudate.   Eyes:      General: No scleral icterus.  Right eye: No discharge.         Left eye: No discharge.      Conjunctiva/sclera: Conjunctivae normal.   Cardiovascular:      Rate and Rhythm: Normal rate and regular rhythm.      Heart sounds: No murmur heard.     No friction rub. No gallop.   Pulmonary:      Effort: No respiratory distress.      Breath sounds: Normal breath sounds. No decreased breath sounds, wheezing, rhonchi or rales.   Abdominal:      General: Abdomen is flat. There is no distension.   Musculoskeletal:         General: No deformity.      Cervical back: Neck supple.   Lymphadenopathy:      Cervical: No cervical adenopathy.   Skin:     Coloration: Skin is not jaundiced or pale.      Findings: No rash.   Neurological:      Mental Status: He is alert and oriented to person,  place, and time.      Gait: Gait normal.   Psychiatric:         Mood and Affect: Mood normal.         Behavior: Behavior normal.         Thought Content: Thought content normal.         Judgment: Judgment normal.         Assessment/Plan:     ICD-10-CM    1. Pruritus  L29.9 hydrOXYzine  HCl (ATARAX ) 25 MG tablet      2. Viral illness  B34.9            Problem List           Diagnosed       Musculoskeletal and Integument    Pruritus - Primary      Seems to have resolved. Characteristics suggest this was not shingles. Rather, could have been a viral exanthem. Trial hydroxyzine  if needed. Rtc if needed.          Relevant Medications    hydrOXYzine  HCl (ATARAX ) 25 MG tablet       Other    Viral illness     Offered bromfed. Pt declines as s/s are improving.              Stanley LITTIE Golder III, MD          "

## 2024-04-19 ENCOUNTER — Encounter
